# Patient Record
Sex: Male | Born: 1980 | Race: Black or African American | Hispanic: No | Marital: Married | Smoking: Current every day smoker
Health system: Southern US, Community
[De-identification: ages and names within clinical notes are randomized; demographics above are authoritative.]

## PROBLEM LIST (undated history)

## (undated) DIAGNOSIS — I10 Essential (primary) hypertension: Secondary | ICD-10-CM

## (undated) DIAGNOSIS — R079 Chest pain, unspecified: Secondary | ICD-10-CM

## (undated) DIAGNOSIS — E785 Hyperlipidemia, unspecified: Secondary | ICD-10-CM

## (undated) DIAGNOSIS — R42 Dizziness and giddiness: Secondary | ICD-10-CM

## (undated) DIAGNOSIS — Z899 Acquired absence of limb, unspecified: Secondary | ICD-10-CM

## (undated) DIAGNOSIS — F419 Anxiety disorder, unspecified: Secondary | ICD-10-CM

## (undated) DIAGNOSIS — K219 Gastro-esophageal reflux disease without esophagitis: Secondary | ICD-10-CM

## (undated) DIAGNOSIS — F32A Depression, unspecified: Secondary | ICD-10-CM

## (undated) HISTORY — DX: Chest pain, unspecified: R07.9

## (undated) HISTORY — DX: Essential (primary) hypertension: I10

## (undated) HISTORY — DX: Depression, unspecified: F32.A

## (undated) HISTORY — DX: Hyperlipidemia, unspecified: E78.5

## (undated) HISTORY — DX: Gastro-esophageal reflux disease without esophagitis: K21.9

## (undated) HISTORY — DX: Dizziness and giddiness: R42

## (undated) HISTORY — DX: Acquired absence of limb, unspecified: Z89.9

## (undated) HISTORY — DX: Anxiety disorder, unspecified: F41.9

---
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
## 2003-05-25 ENCOUNTER — Encounter: Admission: RE | Admit: 2003-05-25 | Discharge: 2003-05-25 | Payer: Self-pay | Admitting: Occupational Medicine

## 2003-05-25 ENCOUNTER — Encounter: Payer: Self-pay | Admitting: Occupational Medicine

## 2007-03-31 ENCOUNTER — Emergency Department (HOSPITAL_COMMUNITY): Admission: EM | Admit: 2007-03-31 | Discharge: 2007-03-31 | Payer: Self-pay | Admitting: Emergency Medicine

## 2008-09-24 ENCOUNTER — Emergency Department (HOSPITAL_COMMUNITY): Admission: EM | Admit: 2008-09-24 | Discharge: 2008-09-24 | Payer: Self-pay | Admitting: Emergency Medicine

## 2010-02-26 ENCOUNTER — Emergency Department (HOSPITAL_COMMUNITY): Admission: EM | Admit: 2010-02-26 | Discharge: 2010-02-26 | Payer: Self-pay | Admitting: Emergency Medicine

## 2010-02-28 ENCOUNTER — Emergency Department (HOSPITAL_COMMUNITY): Admission: EM | Admit: 2010-02-28 | Discharge: 2010-02-28 | Payer: Self-pay | Admitting: Emergency Medicine

## 2010-03-19 ENCOUNTER — Emergency Department (HOSPITAL_COMMUNITY): Admission: EM | Admit: 2010-03-19 | Discharge: 2010-03-19 | Payer: Self-pay | Admitting: Emergency Medicine

## 2015-08-03 ENCOUNTER — Encounter (HOSPITAL_COMMUNITY): Payer: Self-pay | Admitting: *Deleted

## 2015-08-03 ENCOUNTER — Emergency Department (HOSPITAL_COMMUNITY): Payer: Self-pay

## 2015-08-03 ENCOUNTER — Emergency Department (HOSPITAL_COMMUNITY)
Admission: EM | Admit: 2015-08-03 | Discharge: 2015-08-03 | Disposition: A | Discharge status: Home / Self Care | Payer: Self-pay | Attending: Emergency Medicine | Admitting: Emergency Medicine

## 2015-08-03 DIAGNOSIS — R42 Dizziness and giddiness: Secondary | ICD-10-CM | POA: Insufficient documentation

## 2015-08-03 DIAGNOSIS — Z720 Tobacco use: Secondary | ICD-10-CM | POA: Insufficient documentation

## 2015-08-03 DIAGNOSIS — R112 Nausea with vomiting, unspecified: Secondary | ICD-10-CM | POA: Insufficient documentation

## 2015-08-03 LAB — COMPREHENSIVE METABOLIC PANEL
ALK PHOS: 80 U/L (ref 38–126)
ALT: 30 U/L (ref 17–63)
ANION GAP: 10 (ref 5–15)
AST: 25 U/L (ref 15–41)
Albumin: 5 g/dL (ref 3.5–5.0)
BILIRUBIN TOTAL: 1 mg/dL (ref 0.3–1.2)
BUN: 11 mg/dL (ref 6–20)
CALCIUM: 9.8 mg/dL (ref 8.9–10.3)
CO2: 24 mmol/L (ref 22–32)
Chloride: 104 mmol/L (ref 101–111)
Creatinine, Ser: 1.14 mg/dL (ref 0.61–1.24)
GFR calc Af Amer: >60 mL/min (ref >60)
GFR calc non Af Amer: >60 mL/min (ref >60)
GLUCOSE: 94 mg/dL (ref 65–99)
Potassium: 4 mmol/L (ref 3.5–5.1)
Sodium: 138 mmol/L (ref 135–145)
Total Protein: 8.4 g/dL — ABNORMAL HIGH (ref 6.5–8.1)

## 2015-08-03 LAB — LIPASE, BLOOD: Lipase: 24 U/L (ref 11–51)

## 2015-08-03 LAB — WET PREP, GENITAL
Clue Cells Wet Prep HPF POC: NONE SEEN
Trich, Wet Prep: NONE SEEN
WBC, Wet Prep HPF POC: NONE SEEN
Yeast Wet Prep HPF POC: NONE SEEN

## 2015-08-03 LAB — CBC
HEMATOCRIT: 47.6 % (ref 39.0–52.0)
HEMOGLOBIN: 17 g/dL (ref 13.0–17.0)
MCH: 30.6 pg (ref 26.0–34.0)
MCHC: 35.7 g/dL (ref 30.0–36.0)
MCV: 85.6 fL (ref 78.0–100.0)
Platelets: 203 10*3/uL (ref 150–400)
RBC: 5.56 MIL/uL (ref 4.22–5.81)
RDW: 13.6 % (ref 11.5–15.5)
WBC: 11.7 10*3/uL — ABNORMAL HIGH (ref 4.0–10.5)

## 2015-08-03 LAB — URINALYSIS, ROUTINE W REFLEX MICROSCOPIC
BILIRUBIN URINE: NEGATIVE
GLUCOSE, UA: NEGATIVE mg/dL
HGB URINE DIPSTICK: NEGATIVE
KETONES UR: NEGATIVE mg/dL
Leukocytes, UA: NEGATIVE
Nitrite: NEGATIVE
PROTEIN: NEGATIVE mg/dL
SPECIFIC GRAVITY, URINE: 1.019 (ref 1.005–1.030)
Urobilinogen, UA: 0.2 mg/dL (ref 0.0–1.0)
pH: 6 (ref 5.0–8.0)

## 2015-08-03 LAB — CBG MONITORING, ED: GLUCOSE-CAPILLARY: 97 mg/dL (ref 65–99)

## 2015-08-03 MED ORDER — ONDANSETRON HCL 4 MG PO TABS: 4.0000 mg | ORAL_TABLET | Freq: Three times a day (TID) | ORAL | Status: DC | PRN

## 2015-08-03 NOTE — ED Notes (Addendum)
Pt reports dizziness and vomiting x2 days. Denies abd pain. Reports lower back pain 2/10. Drinks ETOH daily. Denies blood in urine or stool. Denies dysuria. Last bowel movement this am, diarrhea.

## 2015-08-03 NOTE — ED Provider Notes (Signed)
CSN: [id number]     Arrival date & time 08/03/15  1347 History   First MD Initiated Contact with Patient 08/03/15 1503     Chief Complaint  Patient presents with  . Dizziness  . Emesis     (Consider location/radiation/quality/duration/timing/severity/associated sxs/prior Treatment) HPI   Jason Li is a 34 y.o. male with no significant PMH who presents with 2 day history of dizziness.  Patient states he became dizzy 2 days ago when he was walking down the stairs while smoking a cigarette.   He also felt like his heart was racing and left arm sharp pains.  He states it has been constant for the past 2 days.  Seems to be made worse with smoking and stress.  Nothing tried to make it better.  Associated symptoms include nonbloody diarrhea, SOB, lightheadedness, visual floaters, headache, cough (chronic), and dysuria.  Denies bloody stools, hematuria, N/V, CP, vertigo, diplopia, abdominal pain, urinary frequency, fall, LOC, difficulty walking, or penile discharge.  He also states he has had a decreased appetite.  Smokes approximately a pack a day.  States he drinks 4 beers a day.  Denies any chronic illnesses.  No PCP.    No past medical history on file. No past surgical history on file. No family history on file. Social History  Substance Use Topics  . Smoking status: Current Every Day Smoker -- 0.50 packs/day for 20 years    Types: Cigarettes  . Smokeless tobacco: Not on file  . Alcohol Use: Yes     Comment: daily- 4 drinks/day    Review of Systems All other systems negative unless otherwise stated in HPI    Allergies  Review of patient's allergies indicates no known allergies.  Home Medications   Prior to Admission medications   Medication Sig Start Date End Date Taking? Authorizing Provider  ondansetron (ZOFRAN) 4 MG tablet Take 1 tablet (4 mg total) by mouth every 8 (eight) hours as needed for nausea or vomiting. 08/03/15   Ammy Lienhard, PA-C   BP 137/97 mmHg  Pulse 86   Temp(Src) 98.4 F (36.9 C) (Oral)  Resp 23  SpO2 100% Physical Exam  Constitutional: He is oriented to person, place, and time. He appears well-developed and well-nourished. No distress.  HENT:  Head: Normocephalic and atraumatic.  Mouth/Throat: Oropharynx is clear and moist.  Eyes: Conjunctivae are normal. Pupils are equal, round, and reactive to light.  Neck: Normal range of motion. Neck supple.  Cardiovascular: Normal rate, regular rhythm, normal heart sounds and intact distal pulses.   No murmur heard. Pulmonary/Chest: Effort normal and breath sounds normal. No accessory muscle usage or stridor. No respiratory distress. He has no wheezes. He has no rhonchi. He has no rales.  Abdominal: Soft. Bowel sounds are normal. He exhibits no distension. There is no tenderness. There is no rebound and no guarding.  Musculoskeletal: Normal range of motion.  Lymphadenopathy:    He has no cervical adenopathy.  Neurological: He is alert and oriented to person, place, and time.  Mental Status:   AOx3.  Speech clear without dysarthria. Cranial Nerves:  I-not tested  II-PERRLA  III, IV, VI-EOMs intact  V-temporal and masseter strength intact  VII-symmetrical facial movements intact, no facial droop  VIII-hearing grossly intact bilaterally  IX, X-gag intact  XI-strength of sternomastoid and trapezius muscles 5/5  XII-tongue midline Motor:   Good muscle bulk and tone  Strength 5/5 bilaterally in upper and lower extremities   Cerebellar--RAMs, finger to nose intact, heel  to shin intact  Romberg--maintains balance with eyes closed  Casual and tandem gait normal without ataxia  No pronator drift Sensory:  Intact in upper and lower extremities   Skin: Skin is warm and dry. He is not diaphoretic.  Psychiatric: He has a normal mood and affect. His behavior is normal.    ED Course  Procedures (including critical care time) Labs Review Labs Reviewed  COMPREHENSIVE METABOLIC PANEL -  Abnormal; Notable for the following:    Total Protein 8.4 (*)    All other components within normal limits  CBC - Abnormal; Notable for the following:    WBC 11.7 (*)    All other components within normal limits  WET PREP, GENITAL  LIPASE, BLOOD  URINALYSIS, ROUTINE W REFLEX MICROSCOPIC (NOT AT [facility])  CBG MONITORING, ED  GC/CHLAMYDIA PROBE AMP ([place]) NOT AT [facility]    Imaging Review Dg Chest 2 View  08/03/2015  CLINICAL DATA:  Shortness breath for 2-3 months, heart flutter for 3+ months. Chronic cough. EXAM: CHEST  2 VIEW COMPARISON:  None. FINDINGS: Heart size is normal. Overall cardiomediastinal silhouette is normal in size and configuration. Lungs are at least mildly hyperexpanded suggesting COPD. Lungs are clear. No pleural effusion. No pneumothorax. No osseous abnormality seen. IMPRESSION: 1. Lungs are at least mildly hyperexpanded suggesting COPD. 2. No evidence of acute cardiopulmonary abnormality. Heart size is normal. Lungs are clear. Electronically Signed   By: Bary Richard M.D.   On: 08/03/2015 16:13   I have personally reviewed and evaluated these images and lab results as part of my medical decision-making.   EKG Interpretation   Date/Time:  Saturday August 03 2015 15:50:03 EDT Ventricular Rate:  84 PR Interval:  140 QRS Duration: 81 QT Interval:  369 QTC Calculation: 436 R Axis:   34 Text Interpretation:  Sinus rhythm Borderline T abnormalities, inferior  leads No previous ECGs available Confirmed by [facility] MD, Barbara Cower [id number]) on  08/03/2015 3:54:44 PM      MDM   Final diagnoses:  Dizziness  Non-intractable vomiting with nausea, vomiting of unspecified type    Patient presents with dizziness x 2 days that began while smoking.  Assoc sxs include palpitations and sharp left arm pain.  VS, afebrile, HR 96, RR 20; 99%, 157/100.  On exam, no focal neurological deficits.  Heart RRR, lungs CTAB, abdomen soft, no rebound or guarding.  Will obtain lipase, CMP, CBC,  UA, CXR, EKG, and orthostatic vitals. Doubt neurogenic etiology given history and physical exam findings.  EKG shows NSR, not acute.  Doubt cardiac etiology.  No indication for further evaluation. Wet prep unremarkable CBG 97 Lipase and CMP unremarkable CBC shows mild leukocystosis of 11.7.  Doubt anemia. UA negative CXR shows mild hyperexpansion suggesting COPD.  No evidence of acute disease.  Will give zofran for reported emesis.  Patient stable for discharge.  Discussed return precautions.  Patient agrees and acknowledges the above plan for discharge.  Case has been discussed with and seen by Dr. Clayborne Dana who agrees with the above plan for discharge.   Cheri Fowler, PA-C 08/03/15 1801  Marily Memos, MD 08/03/15 6045  Marily Memos, MD 08/03/15 2251

## 2015-08-03 NOTE — Discharge Instructions (Signed)
Dizziness Dizziness is a common problem. It is a feeling of unsteadiness or light-headedness. You may feel like you are about to faint. Dizziness can lead to injury if you stumble or fall. Anyone can become dizzy, but dizziness is more common in older adults. This condition can be caused by a number of things, including medicines, dehydration, or illness. HOME CARE INSTRUCTIONS Taking these steps may help with your condition: Eating and Drinking  Drink enough fluid to keep your urine clear or pale yellow. This helps to keep you from becoming dehydrated. Try to drink more clear fluids, such as water.  Do not drink alcohol.  Limit your caffeine intake if directed by your health care provider.  Limit your salt intake if directed by your health care provider. Activity  Avoid making quick movements.  Rise slowly from chairs and steady yourself until you feel okay.  In the morning, first sit up on the side of the bed. When you feel okay, stand slowly while you hold onto something until you know that your balance is fine.  Move your legs often if you need to stand in one place for a long time. Tighten and relax your muscles in your legs while you are standing.  Do not drive or operate heavy machinery if you feel dizzy.  Avoid bending down if you feel dizzy. Place items in your home so that they are easy for you to reach without leaning over. Lifestyle  Do not use any tobacco products, including cigarettes, chewing tobacco, or electronic cigarettes. If you need help quitting, ask your health care provider.  Try to reduce your stress level, such as with yoga or meditation. Talk with your health care provider if you need help. General Instructions  Watch your dizziness for any changes.  Take medicines only as directed by your health care provider. Talk with your health care provider if you think that your dizziness is caused by a medicine that you are taking.  Tell a friend or a family  member that you are feeling dizzy. If he or she notices any changes in your behavior, have this person call your health care provider.  Keep all follow-up visits as directed by your health care provider. This is important. SEEK MEDICAL CARE IF:  Your dizziness does not go away.  Your dizziness or light-headedness gets worse.  You feel nauseous.  You have reduced hearing.  You have new symptoms.  You are unsteady on your feet or you feel like the room is spinning. SEEK IMMEDIATE MEDICAL CARE IF:  You vomit or have diarrhea and are unable to eat or drink anything.  You have problems talking, walking, swallowing, or using your arms, hands, or legs.  You feel generally weak.  You are not thinking clearly or you have trouble forming sentences. It may take a friend or family member to notice this.  You have chest pain, abdominal pain, shortness of breath, or sweating.  Your vision changes.  You notice any bleeding.  You have a headache.  You have neck pain or a stiff neck.  You have a fever.   This information is not intended to replace advice given to you by your health care provider. Make sure you discuss any questions you have with your health care provider.   Document Released: 03/17/2001 Document Revised: 02/05/2015 Document Reviewed: 09/17/2014 Elsevier Interactive Patient Education 2016 Elsevier Inc.  Nausea, Adult Nausea is the feeling that you have an upset stomach or have to vomit. Nausea by  itself is not likely a serious concern, but it may be an early sign of more serious medical problems. As nausea gets worse, it can lead to vomiting. If vomiting develops, there is the risk of dehydration.  CAUSES   Viral infections.  Food poisoning.  Medicines.  Pregnancy.  Motion sickness.  Migraine headaches.  Emotional distress.  Severe pain from any source.  Alcohol intoxication. HOME CARE INSTRUCTIONS  Get plenty of rest.  Ask your caregiver about  specific rehydration instructions.  Eat small amounts of food and sip liquids more often.  Take all medicines as told by your caregiver. SEEK MEDICAL CARE IF:  You have not improved after 2 days, or you get worse.  You have a headache. SEEK IMMEDIATE MEDICAL CARE IF:   You have a fever.  You faint.  You keep vomiting or have blood in your vomit.  You are extremely weak or dehydrated.  You have dark or bloody stools.  You have severe chest or abdominal pain. MAKE SURE YOU:  Understand these instructions.  Will watch your condition.  Will get help right away if you are not doing well or get worse.   This information is not intended to replace advice given to you by your health care provider. Make sure you discuss any questions you have with your health care provider.   Document Released: 10/29/2004 Document Revised: 10/12/2014 Document Reviewed: 06/03/2011 Elsevier Interactive Patient Education [employer]2016 Elsevier Inc.

## 2015-08-03 NOTE — ED Notes (Signed)
Pt has multiple complaints--loss of appetite, heart palpitations, back pain, leg pain, light headedness, dizzy spells, vomiting.

## 2015-08-05 LAB — GC/CHLAMYDIA PROBE AMP (~~LOC~~) NOT AT ARMC
Chlamydia: NEGATIVE
Neisseria Gonorrhea: NEGATIVE

## 2015-12-26 ENCOUNTER — Emergency Department (HOSPITAL_COMMUNITY): Payer: Self-pay

## 2015-12-26 ENCOUNTER — Encounter (HOSPITAL_COMMUNITY): Payer: Self-pay | Admitting: Emergency Medicine

## 2015-12-26 ENCOUNTER — Emergency Department (HOSPITAL_COMMUNITY)
Admission: EM | Admit: 2015-12-26 | Discharge: 2015-12-27 | Disposition: A | Discharge status: Home / Self Care | Payer: Self-pay | Attending: Emergency Medicine | Admitting: Emergency Medicine

## 2015-12-26 DIAGNOSIS — R1013 Epigastric pain: Secondary | ICD-10-CM | POA: Insufficient documentation

## 2015-12-26 DIAGNOSIS — R112 Nausea with vomiting, unspecified: Secondary | ICD-10-CM | POA: Insufficient documentation

## 2015-12-26 DIAGNOSIS — F17210 Nicotine dependence, cigarettes, uncomplicated: Secondary | ICD-10-CM | POA: Insufficient documentation

## 2015-12-26 LAB — CBC
HEMATOCRIT: 46.8 % (ref 39.0–52.0)
HEMOGLOBIN: 16.1 g/dL (ref 13.0–17.0)
MCH: 29.7 pg (ref 26.0–34.0)
MCHC: 34.4 g/dL (ref 30.0–36.0)
MCV: 86.2 fL (ref 78.0–100.0)
Platelets: 208 10*3/uL (ref 150–400)
RBC: 5.43 MIL/uL (ref 4.22–5.81)
RDW: 13.3 % (ref 11.5–15.5)
WBC: 9.7 10*3/uL (ref 4.0–10.5)

## 2015-12-26 LAB — COMPREHENSIVE METABOLIC PANEL
ALT: 20 U/L (ref 17–63)
ANION GAP: 11 (ref 5–15)
AST: 19 U/L (ref 15–41)
Albumin: 4.5 g/dL (ref 3.5–5.0)
Alkaline Phosphatase: 73 U/L (ref 38–126)
BILIRUBIN TOTAL: 1.5 mg/dL — AB (ref 0.3–1.2)
BUN: 10 mg/dL (ref 6–20)
CHLORIDE: 101 mmol/L (ref 101–111)
CO2: 28 mmol/L (ref 22–32)
Calcium: 9.6 mg/dL (ref 8.9–10.3)
Creatinine, Ser: 1.08 mg/dL (ref 0.61–1.24)
GFR calc Af Amer: >60 mL/min (ref >60)
Glucose, Bld: 96 mg/dL (ref 65–99)
POTASSIUM: 4.2 mmol/L (ref 3.5–5.1)
Sodium: 140 mmol/L (ref 135–145)
TOTAL PROTEIN: 7.9 g/dL (ref 6.5–8.1)

## 2015-12-26 LAB — URINALYSIS, ROUTINE W REFLEX MICROSCOPIC
Glucose, UA: NEGATIVE mg/dL
Hgb urine dipstick: NEGATIVE
Ketones, ur: NEGATIVE mg/dL
LEUKOCYTES UA: NEGATIVE
NITRITE: NEGATIVE
PH: 5.5 (ref 5.0–8.0)
Protein, ur: NEGATIVE mg/dL
SPECIFIC GRAVITY, URINE: 1.024 (ref 1.005–1.030)

## 2015-12-26 LAB — LIPASE, BLOOD: LIPASE: 23 U/L (ref 11–51)

## 2015-12-26 MED ORDER — GI COCKTAIL ~~LOC~~
30.0000 mL | Freq: Once | ORAL | Status: AC
  Administered 2015-12-27: 30 mL via ORAL
  Filled 2015-12-26: qty 30

## 2015-12-26 NOTE — ED Notes (Addendum)
Pt states he has been feeling bloated for about 2-3 days despite not eating. Pt states he has left arm pain that has lasted for about the same amount of time from sleeping on his arm wrong. Pt also presents with c/o anxiety; he states he gets worried and stressed and is unable to control his anxiety levels at home.  Pt's last bowel movement was today and was normal, pt had 1 episode of emesis this am. Pt states he drinks a lot of beer, his last of which was last night. Pt states on a normal day he drinks about 7 beers a night. Pt also states he has burning with urination.

## 2015-12-26 NOTE — ED Provider Notes (Signed)
CSN: [id number]     Arrival date & time 12/26/15  2044 History   First MD Initiated Contact with Patient 12/26/15 2211     No chief complaint on file.    (Consider location/radiation/quality/duration/timing/severity/associated sxs/prior Treatment) Patient is a 35 y.o. male presenting with abdominal pain. The history is provided by the patient.  Abdominal Pain Pain location:  Epigastric Pain quality: sharp and shooting   Pain radiation: left arm. Pain severity:  Moderate Onset quality:  Sudden Duration:  2 days Timing:  Constant Progression:  Worsening Chronicity:  New Relieved by:  Nothing Worsened by:  Nothing tried Ineffective treatments:  None tried Associated symptoms: nausea and vomiting   Associated symptoms: no chest pain, no chills, no diarrhea, no fever and no shortness of breath    35 yo M With a chief complaint of feeling like his abdomen is bloated. This been going on for the past couple days. Patient has some mild epigastric burning as well. Patient has an everyday drinker feels like this makes it worse. Has had a couple episodes of vomiting, denies diarrhea.   History reviewed. No pertinent past medical history. History reviewed. No pertinent past surgical history. No family history on file. Social History  Substance Use Topics  . Smoking status: Current Every Day Smoker -- 0.50 packs/day for 20 years    Types: Cigarettes  . Smokeless tobacco: None  . Alcohol Use: Yes     Comment: daily- 7 drinks/day    Review of Systems  Constitutional: Negative for fever and chills.  HENT: Negative for congestion and facial swelling.   Eyes: Negative for discharge and visual disturbance.  Respiratory: Negative for shortness of breath.   Cardiovascular: Negative for chest pain and palpitations.  Gastrointestinal: Positive for nausea, vomiting and abdominal pain. Negative for diarrhea.  Musculoskeletal: Negative for myalgias and arthralgias.  Skin: Negative for color  change and rash.  Neurological: Negative for tremors, syncope and headaches.  Psychiatric/Behavioral: Negative for confusion and dysphoric mood.      Allergies  Review of patient's allergies indicates no known allergies.  Home Medications   Prior to Admission medications   Medication Sig Start Date End Date Taking? Authorizing Provider  ibuprofen (ADVIL,MOTRIN) 200 MG tablet Take 400 mg by mouth every 6 (six) hours as needed for moderate pain.   Yes Historical Provider, MD  ondansetron (ZOFRAN) 4 MG tablet Take 1 tablet (4 mg total) by mouth every 8 (eight) hours as needed for nausea or vomiting. Patient not taking: Reported on 12/26/2015 08/03/15   Cheri Fowler, PA-C   BP 136/101 mmHg  Pulse 73  Temp(Src) 98.6 F (37 C) (Oral)  Resp 16  SpO2 97% Physical Exam  Constitutional: He is oriented to person, place, and time. He appears well-developed and well-nourished.  HENT:  Head: Normocephalic and atraumatic.  Eyes: EOM are normal. Pupils are equal, round, and reactive to light.  Neck: Normal range of motion. Neck supple. No JVD present.  Cardiovascular: Normal rate and regular rhythm.  Exam reveals no gallop and no friction rub.   No murmur heard. Pulmonary/Chest: No respiratory distress. He has no wheezes.  Abdominal: He exhibits no distension. There is no tenderness. There is no rebound and no guarding.  Benign abdominal exam   Musculoskeletal: Normal range of motion.  Neurological: He is alert and oriented to person, place, and time.  Skin: No rash noted. No pallor.  Psychiatric: He has a normal mood and affect. His behavior is normal.  Nursing note and  vitals reviewed.   ED Course  Procedures (including critical care time) Labs Review Labs Reviewed  COMPREHENSIVE METABOLIC PANEL - Abnormal; Notable for the following:    Total Bilirubin 1.5 (*)    All other components within normal limits  URINALYSIS, ROUTINE W REFLEX MICROSCOPIC (NOT AT [facility]) - Abnormal; Notable for  the following:    Color, Urine AMBER (*)    Bilirubin Urine SMALL (*)    All other components within normal limits  LIPASE, BLOOD  CBC    Imaging Review Dg Chest 2 View  12/26/2015  CLINICAL DATA:  Upper abdominal pain going into the right chest for 3 weeks. Nausea. Pain radiates to the left arm. EXAM: CHEST  2 VIEW COMPARISON:  08/03/2015 FINDINGS: Mild hyperinflation. The heart size and mediastinal contours are within normal limits. Both lungs are clear. The visualized skeletal structures are unremarkable. IMPRESSION: No active cardiopulmonary disease. Electronically Signed   By: Burman NievesWilliam  Stevens M.D.   On: 12/26/2015 23:11   I have personally reviewed and evaluated these images and lab results as part of my medical decision-making.   EKG Interpretation   Date/Time:  Thursday December 26 2015 23:07:47 EDT Ventricular Rate:  72 PR Interval:  142 QRS Duration: 86 QT Interval:  371 QTC Calculation: 406 R Axis:   -2 Text Interpretation:  Sinus rhythm Consider left atrial enlargement  Nonspecific T abnormalities, inferior leads No significant change since  last tracing Confirmed by Ceaser Ebeling MD, Reuel BoomANIEL [id number]) on 12/26/2015 11:14:45  PM      MDM   Final diagnoses:  Epigastric abdominal pain    35 yo M with a cc of bloating.  Also has some epigastric pain that radiates to the left arm.  Benign abdominal exam.  Doubt acs, will obtain lab eval cxr, ecg.   Laboratory evaluation is unremarkable. Chest x-ray negative as viewed by me. EKG unremarkable and unchanged from previous. Trial of zantac, d/c home.   Discussed smoking cessation with patient and was they were offerred resources to help stop.  Total time was 5 min CPT code [id number].   11:40 PM:  I have discussed the diagnosis/risks/treatment options with the patient and family and believe the pt to be eligible for discharge home to follow-up with PCP. We also discussed returning to the ED immediately if new or worsening sx occur. We  discussed the sx which are most concerning (e.g., sudden worsening pain, fever, inability to tolerate by mouth) that necessitate immediate return. Medications administered to the patient during their visit and any new prescriptions provided to the patient are listed below.  Medications given during this visit Medications  gi cocktail (Maalox,Lidocaine,Donnatal) (not administered)    New Prescriptions   No medications on file    The patient appears reasonably screen and/or stabilized for discharge and I doubt any other medical condition or other [facility] requiring further screening, evaluation, or treatment in the ED at this time prior to discharge.      Melene Planan Tashari Schoenfelder, DO 12/26/15 2340

## 2015-12-26 NOTE — ED Notes (Signed)
Pt reports epigastric pain rated 8/10 and burning on urination, no penile discharge x 2 weeks. He did not see a doctor when there was discharge previously but states that the symptoms resolved on their own.

## 2015-12-26 NOTE — Discharge Instructions (Signed)
Try zantac 150mg twice a day.   °Abdominal Pain, Adult °Many things can cause abdominal pain. Usually, abdominal pain is not caused by a disease and will improve without treatment. It can often be observed and treated at home. Your health care provider will do a physical exam and possibly order blood tests and X-rays to help determine the seriousness of your pain. However, in many cases, more time must pass before a clear cause of the pain can be found. Before that point, your health care provider may not know if you need more testing or further treatment. °HOME CARE INSTRUCTIONS °Monitor your abdominal pain for any changes. The following actions may help to alleviate any discomfort you are experiencing: °· Only take over-the-counter or prescription medicines as directed by your health care provider. °· Do not take laxatives unless directed to do so by your health care provider. °· Try a clear liquid diet (broth, tea, or water) as directed by your health care provider. Slowly move to a bland diet as tolerated. °SEEK MEDICAL CARE IF: °· You have unexplained abdominal pain. °· You have abdominal pain associated with nausea or diarrhea. °· You have pain when you urinate or have a bowel movement. °· You experience abdominal pain that wakes you in the night. °· You have abdominal pain that is worsened or improved by eating food. °· You have abdominal pain that is worsened with eating fatty foods. °· You have a fever. °SEEK IMMEDIATE MEDICAL CARE IF: °· Your pain does not go away within 2 hours. °· You keep throwing up (vomiting). °· Your pain is felt only in portions of the abdomen, such as the right side or the left lower portion of the abdomen. °· You pass bloody or black tarry stools. °MAKE SURE YOU: °· Understand these instructions. °· Will watch your condition. °· Will get help right away if you are not doing well or get worse. °  °This information is not intended to replace advice given to you by your health care  provider. Make sure you discuss any questions you have with your health care provider. °  °Document Released: 07/01/2005 Document Revised: 06/12/2015 Document Reviewed: 05/31/2013 °Elsevier Interactive Patient Education ©2016 Elsevier Inc. ° °

## 2016-02-28 ENCOUNTER — Ambulatory Visit: Payer: Self-pay

## 2016-04-11 ENCOUNTER — Emergency Department (HOSPITAL_COMMUNITY): Payer: Self-pay

## 2016-04-11 ENCOUNTER — Emergency Department (HOSPITAL_COMMUNITY)
Admission: EM | Admit: 2016-04-11 | Discharge: 2016-04-11 | Disposition: A | Discharge status: Home / Self Care | Payer: Self-pay | Attending: Emergency Medicine | Admitting: Emergency Medicine

## 2016-04-11 ENCOUNTER — Encounter (HOSPITAL_COMMUNITY): Payer: Self-pay | Admitting: Emergency Medicine

## 2016-04-11 DIAGNOSIS — Z79899 Other long term (current) drug therapy: Secondary | ICD-10-CM | POA: Insufficient documentation

## 2016-04-11 DIAGNOSIS — R0789 Other chest pain: Secondary | ICD-10-CM | POA: Insufficient documentation

## 2016-04-11 DIAGNOSIS — R079 Chest pain, unspecified: Secondary | ICD-10-CM

## 2016-04-11 DIAGNOSIS — F17210 Nicotine dependence, cigarettes, uncomplicated: Secondary | ICD-10-CM | POA: Insufficient documentation

## 2016-04-11 LAB — BASIC METABOLIC PANEL
Anion gap: 9 (ref 5–15)
BUN: 13 mg/dL (ref 6–20)
CHLORIDE: 104 mmol/L (ref 101–111)
CO2: 25 mmol/L (ref 22–32)
CREATININE: 1.15 mg/dL (ref 0.61–1.24)
Calcium: 9.5 mg/dL (ref 8.9–10.3)
GFR calc Af Amer: >60 mL/min (ref >60)
GFR calc non Af Amer: >60 mL/min (ref >60)
GLUCOSE: 91 mg/dL (ref 65–99)
POTASSIUM: 3.8 mmol/L (ref 3.5–5.1)
SODIUM: 138 mmol/L (ref 135–145)

## 2016-04-11 LAB — HEPATIC FUNCTION PANEL
ALK PHOS: 79 U/L (ref 38–126)
ALT: 28 U/L (ref 17–63)
AST: 23 U/L (ref 15–41)
Albumin: 4.7 g/dL (ref 3.5–5.0)
BILIRUBIN DIRECT: 0.1 mg/dL (ref 0.1–0.5)
BILIRUBIN INDIRECT: 0.9 mg/dL (ref 0.3–0.9)
BILIRUBIN TOTAL: 1 mg/dL (ref 0.3–1.2)
Total Protein: 8.1 g/dL (ref 6.5–8.1)

## 2016-04-11 LAB — CBC
HEMATOCRIT: 46.5 % (ref 39.0–52.0)
Hemoglobin: 16.7 g/dL (ref 13.0–17.0)
MCH: 30.1 pg (ref 26.0–34.0)
MCHC: 35.9 g/dL (ref 30.0–36.0)
MCV: 83.9 fL (ref 78.0–100.0)
PLATELETS: 220 10*3/uL (ref 150–400)
RBC: 5.54 MIL/uL (ref 4.22–5.81)
RDW: 13.6 % (ref 11.5–15.5)
WBC: 9 10*3/uL (ref 4.0–10.5)

## 2016-04-11 LAB — I-STAT TROPONIN, ED: Troponin i, poc: 0 ng/mL (ref 0.00–0.08)

## 2016-04-11 LAB — LIPASE, BLOOD: LIPASE: 25 U/L (ref 11–51)

## 2016-04-11 NOTE — ED Provider Notes (Signed)
CSN: [id number]     Arrival date & time 04/11/16  1654 History   First MD Initiated Contact with Patient 04/11/16 1739     Chief Complaint  Patient presents with  . Chest Pain      HPI Patient presents with mid chest pain. States it radiated to the right side. States he was at home when he came on. States he was watching TV. It was a dull pain. No real shortness of breath with it. No nausea. States it felt somewhat like gas. States now is more in his upper abdomen. States he had drank a lot of alcohol the last few days. States he does drink about 2 40 ounce beers a day. Denies other drug use. States he had been drinking cigarette when the pain started today. No coronary artery disease history. Denies substance abuse. States his heart felt as if it was beating strangely. Not really fast but it was more aware of.   History reviewed. No pertinent past medical history. No past surgical history on file. No family history on file. Social History  Substance Use Topics  . Smoking status: Current Every Day Smoker -- 0.50 packs/day for 20 years    Types: Cigarettes  . Smokeless tobacco: None  . Alcohol Use: Yes     Comment: daily- 7 drinks/day    Review of Systems  Constitutional: Negative for activity change and appetite change.  Eyes: Negative for pain.  Respiratory: Negative for chest tightness and shortness of breath.   Cardiovascular: Positive for chest pain and palpitations. Negative for leg swelling.  Gastrointestinal: Negative for nausea, vomiting, abdominal pain and diarrhea.  Genitourinary: Negative for flank pain.  Musculoskeletal: Negative for back pain and neck stiffness.  Skin: Negative for rash.  Neurological: Negative for weakness, numbness and headaches.  Psychiatric/Behavioral: Negative for behavioral problems.      Allergies  Review of patient's allergies indicates no known allergies.  Home Medications   Prior to Admission medications   Medication Sig Start Date  End Date Taking? Authorizing Provider  alum & mag hydroxide-simeth (MAALOX/MYLANTA) 200-200-20 MG/5ML suspension Take 30-45 mLs by mouth every 6 (six) hours as needed for indigestion or heartburn.   Yes Historical Provider, MD  Calcium Carbonate Antacid (MAALOX PO) Take 1-2 tablets by mouth daily as needed (indigestion).   Yes Historical Provider, MD   BP 137/110 mmHg  Pulse 97  Temp(Src) 98 F (36.7 C) (Oral)  Resp 18  Ht  (1.676 m)  Wt 180 lb (81.647 kg)  BMI 29.07 kg/m2  SpO2 100% Physical Exam  Constitutional: He is oriented to person, place, and time. He appears well-developed and well-nourished.  HENT:  Head: Normocephalic and atraumatic.  Eyes: Pupils are equal, round, and reactive to light.  Neck: Normal range of motion. Neck supple.  Cardiovascular: Normal rate, regular rhythm and normal heart sounds.   Pulmonary/Chest: Effort normal and breath sounds normal.  Abdominal: Soft. Bowel sounds are normal. He exhibits no distension. There is tenderness.  Mild epigastric tenderness without rebound or guarding.  Musculoskeletal: Normal range of motion. He exhibits no edema.  Neurological: He is alert and oriented to person, place, and time.  Skin: Skin is warm.  Psychiatric: He has a normal mood and affect.  Nursing note and vitals reviewed.   ED Course  Procedures (including critical care time) Labs Review Labs Reviewed  BASIC METABOLIC PANEL  CBC  HEPATIC FUNCTION PANEL  LIPASE, BLOOD  I-STAT TROPOININ, ED    Imaging Review Dg  Chest 2 View  04/11/2016  CLINICAL DATA:  35 year old male with central chest pain and shortness breath for the past 3 hours today. EXAM: CHEST  2 VIEW COMPARISON:  Chest x-ray 12/26/2015. FINDINGS: Lung volumes are normal. No consolidative airspace disease. No pleural effusions. No pneumothorax. No pulmonary nodule or mass noted. Pulmonary vasculature and the cardiomediastinal silhouette are within normal limits. IMPRESSION: No radiographic  evidence of acute cardiopulmonary disease. Electronically Signed   By: Trudie Reedaniel  Entrikin M.D.   On: 04/11/2016 17:20   I have personally reviewed and evaluated these images and lab results as part of my medical decision-making.   EKG Interpretation   Date/Time:  Saturday April 11 2016 17:03:17 EDT Ventricular Rate:  89 PR Interval:    QRS Duration: 75 QT Interval:  336 QTC Calculation: 409 R Axis:   28 Text Interpretation:  Sinus rhythm LAE, consider biatrial enlargement  Borderline T wave abnormalities No significant change since last tracing  Reconfirmed by Aerin Delany  MD, Harrold DonathNATHAN [phone]([id number]) on 04/11/2016 5:44:27 PM      MDM   Final diagnoses:  Chest pain, unspecified chest pain type    Patient with chest pain. EKG and lab work reassuring. Stable from previous. Also had epigastric pain. Lipase and hepatic function normal. Will discharge home. Blood pressure is mildly elevated and will be followed.    Benjiman CoreNathan Eryck Negron, MD 04/11/16 [phone]

## 2016-04-11 NOTE — ED Notes (Signed)
Pt states that around 1330 he was lying in bed and began feeling tightness in his chest that radiates to his right side. Pt denies dizziness, but states he is lightheaded. Pt does not have HTN or DM, but he does drink and smoke. Pt states he last drank etoh last night. Pt is not diaphoretic

## 2016-04-11 NOTE — Discharge Instructions (Signed)
Nonspecific Chest Pain  °Chest pain can be caused by many different conditions. There is always a chance that your pain could be related to something serious, such as a heart attack or a blood clot in your lungs. Chest pain can also be caused by conditions that are not life-threatening. If you have chest pain, it is very important to follow up with your health care provider. °CAUSES  °Chest pain can be caused by: °· Heartburn. °· Pneumonia or bronchitis. °· Anxiety or stress. °· Inflammation around your heart (pericarditis) or lung (pleuritis or pleurisy). °· A blood clot in your lung. °· A collapsed lung (pneumothorax). It can develop suddenly on its own (spontaneous pneumothorax) or from trauma to the chest. °· Shingles infection (varicella-zoster virus). °· Heart attack. °· Damage to the bones, muscles, and cartilage that make up your chest wall. This can include: °¨ Bruised bones due to injury. °¨ Strained muscles or cartilage due to frequent or repeated coughing or overwork. °¨ Fracture to one or more ribs. °¨ Sore cartilage due to inflammation (costochondritis). °RISK FACTORS  °Risk factors for chest pain may include: °· Activities that increase your risk for trauma or injury to your chest. °· Respiratory infections or conditions that cause frequent coughing. °· Medical conditions or overeating that can cause heartburn. °· Heart disease or family history of heart disease. °· Conditions or health behaviors that increase your risk of developing a blood clot. °· Having had chicken pox (varicella zoster). °SIGNS AND SYMPTOMS °Chest pain can feel like: °· Burning or tingling on the surface of your chest or deep in your chest. °· Crushing, pressure, aching, or squeezing pain. °· Dull or sharp pain that is worse when you move, cough, or take a deep breath. °· Pain that is also felt in your back, neck, shoulder, or arm, or pain that spreads to any of these areas. °Your chest pain may come and go, or it may stay  constant. °DIAGNOSIS °Lab tests or other studies may be needed to find the cause of your pain. Your health care provider may have you take a test called an ambulatory ECG (electrocardiogram). An ECG records your heartbeat patterns at the time the test is performed. You may also have other tests, such as: °· Transthoracic echocardiogram (TTE). During echocardiography, sound waves are used to create a picture of all of the heart structures and to look at how blood flows through your heart. °· Transesophageal echocardiogram (TEE). This is a more advanced imaging test that obtains images from inside your body. It allows your health care provider to see your heart in finer detail. °· Cardiac monitoring. This allows your health care provider to monitor your heart rate and rhythm in real time. °· Holter monitor. This is a portable device that records your heartbeat and can help to diagnose abnormal heartbeats. It allows your health care provider to track your heart activity for several days, if needed. °· Stress tests. These can be done through exercise or by taking medicine that makes your heart beat more quickly. °· Blood tests. °· Imaging tests. °TREATMENT  °Your treatment depends on what is causing your chest pain. Treatment may include: °· Medicines. These may include: °¨ Acid blockers for heartburn. °¨ Anti-inflammatory medicine. °¨ Pain medicine for inflammatory conditions. °¨ Antibiotic medicine, if an infection is present. °¨ Medicines to dissolve blood clots. °¨ Medicines to treat coronary artery disease. °· Supportive care for conditions that do not require medicines. This may include: °¨ Resting. °¨ Applying heat   or cold packs to injured areas. °¨ Limiting activities until pain decreases. °HOME CARE INSTRUCTIONS °· If you were prescribed an antibiotic medicine, finish it all even if you start to feel better. °· Avoid any activities that bring on chest pain. °· Do not use any tobacco products, including  cigarettes, chewing tobacco, or electronic cigarettes. If you need help quitting, ask your health care provider. °· Do not drink alcohol. °· Take medicines only as directed by your health care provider. °· Keep all follow-up visits as directed by your health care provider. This is important. This includes any further testing if your chest pain does not go away. °· If heartburn is the cause for your chest pain, you may be told to keep your head raised (elevated) while sleeping. This reduces the chance that acid will go from your stomach into your esophagus. °· Make lifestyle changes as directed by your health care provider. These may include: °¨ Getting regular exercise. Ask your health care provider to suggest some activities that are safe for you. °¨ Eating a heart-healthy diet. A registered dietitian can help you to learn healthy eating options. °¨ Maintaining a healthy weight. °¨ Managing diabetes, if necessary. °¨ Reducing stress. °SEEK MEDICAL CARE IF: °· Your chest pain does not go away after treatment. °· You have a rash with blisters on your chest. °· You have a fever. °SEEK IMMEDIATE MEDICAL CARE IF:  °· Your chest pain is worse. °· You have an increasing cough, or you cough up blood. °· You have severe abdominal pain. °· You have severe weakness. °· You faint. °· You have chills. °· You have sudden, unexplained chest discomfort. °· You have sudden, unexplained discomfort in your arms, back, neck, or jaw. °· You have shortness of breath at any time. °· You suddenly start to sweat, or your skin gets clammy. °· You feel nauseous or you vomit. °· You suddenly feel light-headed or dizzy. °· Your heart begins to beat quickly, or it feels like it is skipping beats. °These symptoms may represent a serious problem that is an emergency. Do not wait to see if the symptoms will go away. Get medical help right away. Call your local emergency services (911 in the [place] Do not drive yourself to the hospital. °  °This  information is not intended to replace advice given to you by your health care provider. Make sure you discuss any questions you have with your health care provider. °  °Document Released: 07/01/2005 Document Revised: 10/12/2014 Document Reviewed: 04/27/2014 °Elsevier Interactive Patient Education ©2016 [employer] ° °

## 2019-01-13 ENCOUNTER — Ambulatory Visit (HOSPITAL_COMMUNITY)
Admission: EM | Admit: 2019-01-13 | Discharge: 2019-01-13 | Disposition: A | Discharge status: Home / Self Care | Attending: Emergency Medicine | Admitting: Emergency Medicine

## 2019-01-13 ENCOUNTER — Emergency Department (HOSPITAL_COMMUNITY)

## 2019-01-13 ENCOUNTER — Emergency Department (HOSPITAL_COMMUNITY): Admitting: Certified Registered"

## 2019-01-13 ENCOUNTER — Encounter (HOSPITAL_COMMUNITY): Payer: Self-pay | Admitting: Emergency Medicine

## 2019-01-13 ENCOUNTER — Encounter (HOSPITAL_COMMUNITY)
Admission: EM | Disposition: A | Discharge status: Home / Self Care | Payer: Self-pay | Source: Home / Self Care | Attending: Emergency Medicine

## 2019-01-13 DIAGNOSIS — W290XXA Contact with powered kitchen appliance, initial encounter: Secondary | ICD-10-CM | POA: Diagnosis not present

## 2019-01-13 DIAGNOSIS — Z6833 Body mass index (BMI) 33.0-33.9, adult: Secondary | ICD-10-CM | POA: Insufficient documentation

## 2019-01-13 DIAGNOSIS — S68129A Partial traumatic metacarpophalangeal amputation of unspecified finger, initial encounter: Secondary | ICD-10-CM

## 2019-01-13 DIAGNOSIS — S68625A Partial traumatic transphalangeal amputation of left ring finger, initial encounter: Secondary | ICD-10-CM | POA: Diagnosis not present

## 2019-01-13 DIAGNOSIS — E669 Obesity, unspecified: Secondary | ICD-10-CM | POA: Diagnosis not present

## 2019-01-13 DIAGNOSIS — S68119A Complete traumatic metacarpophalangeal amputation of unspecified finger, initial encounter: Secondary | ICD-10-CM

## 2019-01-13 DIAGNOSIS — F17210 Nicotine dependence, cigarettes, uncomplicated: Secondary | ICD-10-CM | POA: Insufficient documentation

## 2019-01-13 HISTORY — PX: I&D EXTREMITY: SHX5045

## 2019-01-13 SURGERY — IRRIGATION AND DEBRIDEMENT EXTREMITY
Anesthesia: General | Site: Hand | Laterality: Left

## 2019-01-13 MED ORDER — MIDAZOLAM HCL 2 MG/2ML IJ SOLN: 0.5000 mg | Freq: Once | INTRAMUSCULAR | Status: DC | PRN

## 2019-01-13 MED ORDER — CEPHALEXIN 250 MG PO CAPS: 250.0000 mg | ORAL_CAPSULE | Freq: Four times a day (QID) | ORAL | 0 refills | Status: AC

## 2019-01-13 MED ORDER — ROCURONIUM BROMIDE 10 MG/ML (PF) SYRINGE
PREFILLED_SYRINGE | INTRAVENOUS | Status: DC | PRN
  Administered 2019-01-13: 20 mg via INTRAVENOUS

## 2019-01-13 MED ORDER — PROMETHAZINE HCL 25 MG/ML IJ SOLN: 6.2500 mg | INTRAMUSCULAR | Status: DC | PRN

## 2019-01-13 MED ORDER — TETANUS-DIPHTH-ACELL PERTUSSIS 5-2.5-18.5 LF-MCG/0.5 IM SUSP
0.5000 mL | Freq: Once | INTRAMUSCULAR | Status: AC
  Administered 2019-01-13: 0.5 mL via INTRAMUSCULAR
  Filled 2019-01-13: qty 0.5

## 2019-01-13 MED ORDER — SODIUM CHLORIDE 0.9 % IR SOLN
Status: DC | PRN
  Administered 2019-01-13: 1000 mL

## 2019-01-13 MED ORDER — LIDOCAINE 2% (20 MG/ML) 5 ML SYRINGE
INTRAMUSCULAR | Status: DC | PRN
  Administered 2019-01-13: 20 mg via INTRAVENOUS

## 2019-01-13 MED ORDER — PROPOFOL 10 MG/ML IV BOLUS
INTRAVENOUS | Status: AC
  Filled 2019-01-13: qty 20

## 2019-01-13 MED ORDER — MIDAZOLAM HCL 5 MG/5ML IJ SOLN
INTRAMUSCULAR | Status: DC | PRN
  Administered 2019-01-13: 2 mg via INTRAVENOUS

## 2019-01-13 MED ORDER — FENTANYL CITRATE (PF) 250 MCG/5ML IJ SOLN
INTRAMUSCULAR | Status: DC | PRN
  Administered 2019-01-13 (×2): 50 ug via INTRAVENOUS

## 2019-01-13 MED ORDER — MIDAZOLAM HCL 2 MG/2ML IJ SOLN
INTRAMUSCULAR | Status: AC
  Filled 2019-01-13: qty 2

## 2019-01-13 MED ORDER — LACTATED RINGERS IV SOLN: INTRAVENOUS | Status: DC

## 2019-01-13 MED ORDER — PROPOFOL 10 MG/ML IV BOLUS
INTRAVENOUS | Status: DC | PRN
  Administered 2019-01-13: 200 mg via INTRAVENOUS
  Administered 2019-01-13: 20 mg via INTRAVENOUS

## 2019-01-13 MED ORDER — HYDROMORPHONE HCL 1 MG/ML IJ SOLN: 0.2500 mg | INTRAMUSCULAR | Status: DC | PRN

## 2019-01-13 MED ORDER — POVIDONE-IODINE 10 % EX SWAB: 2.0000 "application " | Freq: Once | CUTANEOUS | Status: DC

## 2019-01-13 MED ORDER — MEPERIDINE HCL 50 MG/ML IJ SOLN: 6.2500 mg | INTRAMUSCULAR | Status: DC | PRN

## 2019-01-13 MED ORDER — HYDROCODONE-ACETAMINOPHEN 5-325 MG PO TABS: 1.0000 | ORAL_TABLET | ORAL | 0 refills | Status: AC | PRN

## 2019-01-13 MED ORDER — CEFAZOLIN SODIUM-DEXTROSE 2-4 GM/100ML-% IV SOLN
INTRAVENOUS | Status: AC
Start: 2019-01-13 — End: 2019-01-13
  Filled 2019-01-13: qty 100

## 2019-01-13 MED ORDER — ONDANSETRON HCL 4 MG/2ML IJ SOLN
INTRAMUSCULAR | Status: DC | PRN
  Administered 2019-01-13: 4 mg via INTRAVENOUS

## 2019-01-13 MED ORDER — FENTANYL CITRATE (PF) 100 MCG/2ML IJ SOLN
50.0000 ug | Freq: Once | INTRAMUSCULAR | Status: AC
  Administered 2019-01-13: 50 ug via INTRAVENOUS
  Filled 2019-01-13: qty 2

## 2019-01-13 MED ORDER — ONDANSETRON HCL 4 MG/2ML IJ SOLN
INTRAMUSCULAR | Status: AC
  Filled 2019-01-13: qty 2

## 2019-01-13 MED ORDER — CHLORHEXIDINE GLUCONATE 4 % EX LIQD
60.0000 mL | Freq: Once | CUTANEOUS | Status: DC
  Filled 2019-01-13: qty 60

## 2019-01-13 MED ORDER — BUPIVACAINE HCL (PF) 0.25 % IJ SOLN
INTRAMUSCULAR | Status: AC
  Filled 2019-01-13: qty 10

## 2019-01-13 MED ORDER — SUCCINYLCHOLINE CHLORIDE 200 MG/10ML IV SOSY
PREFILLED_SYRINGE | INTRAVENOUS | Status: DC | PRN
  Administered 2019-01-13: 160 mg via INTRAVENOUS

## 2019-01-13 MED ORDER — BUPIVACAINE HCL (PF) 0.25 % IJ SOLN
5.0000 mL | Freq: Once | INTRAMUSCULAR | Status: AC
  Administered 2019-01-13: 5 mL
  Filled 2019-01-13: qty 30

## 2019-01-13 MED ORDER — LACTATED RINGERS IV SOLN
INTRAVENOUS | Status: DC | PRN
  Administered 2019-01-13: 16:00:00 via INTRAVENOUS

## 2019-01-13 MED ORDER — BUPIVACAINE HCL 0.5 % IJ SOLN
5.0000 mL | Freq: Once | INTRAMUSCULAR | Status: DC
  Filled 2019-01-13: qty 5

## 2019-01-13 MED ORDER — SUGAMMADEX SODIUM 200 MG/2ML IV SOLN
INTRAVENOUS | Status: DC | PRN
  Administered 2019-01-13: 200 mg via INTRAVENOUS

## 2019-01-13 MED ORDER — ALBUTEROL SULFATE HFA 108 (90 BASE) MCG/ACT IN AERS
INHALATION_SPRAY | RESPIRATORY_TRACT | Status: DC | PRN
  Administered 2019-01-13 (×2): 4 via RESPIRATORY_TRACT

## 2019-01-13 MED ORDER — FENTANYL CITRATE (PF) 250 MCG/5ML IJ SOLN
INTRAMUSCULAR | Status: AC
  Filled 2019-01-13: qty 5

## 2019-01-13 MED ORDER — DEXAMETHASONE SODIUM PHOSPHATE 10 MG/ML IJ SOLN
INTRAMUSCULAR | Status: DC | PRN
  Administered 2019-01-13: 10 mg via INTRAVENOUS

## 2019-01-13 MED ORDER — CEFAZOLIN SODIUM-DEXTROSE 2-4 GM/100ML-% IV SOLN
2.0000 g | INTRAVENOUS | Status: AC
  Administered 2019-01-13: 16:00:00 2 g via INTRAVENOUS

## 2019-01-13 MED ORDER — DEXAMETHASONE SODIUM PHOSPHATE 10 MG/ML IJ SOLN
INTRAMUSCULAR | Status: AC
  Filled 2019-01-13: qty 1

## 2019-01-13 SURGICAL SUPPLY — 50 items
BANDAGE ACE 4X5 VEL STRL LF (GAUZE/BANDAGES/DRESSINGS) ×2 IMPLANT
BLADE 15 SAFETY STRL DISP (BLADE) ×1 IMPLANT
BNDG COHESIVE 1X5 TAN STRL LF (GAUZE/BANDAGES/DRESSINGS) ×1 IMPLANT
BNDG COHESIVE 2X5 TAN STRL LF (GAUZE/BANDAGES/DRESSINGS) ×1 IMPLANT
BNDG CONFORM 2 STRL LF (GAUZE/BANDAGES/DRESSINGS) ×1 IMPLANT
BNDG GAUZE ELAST 4 BULKY (GAUZE/BANDAGES/DRESSINGS) ×6 IMPLANT
CORDS BIPOLAR (ELECTRODE) ×2 IMPLANT
COVER SURGICAL LIGHT HANDLE (MISCELLANEOUS) ×2 IMPLANT
COVER WAND RF STERILE (DRAPES) ×2 IMPLANT
CUFF TOURNIQUET SINGLE 18IN (TOURNIQUET CUFF) ×2 IMPLANT
CUFF TOURNIQUET SINGLE 24IN (TOURNIQUET CUFF) IMPLANT
DRSG ADAPTIC 3X8 NADH LF (GAUZE/BANDAGES/DRESSINGS) ×2 IMPLANT
GAUZE SPONGE 4X4 12PLY STRL (GAUZE/BANDAGES/DRESSINGS) ×2 IMPLANT
GAUZE XEROFORM 1X8 LF (GAUZE/BANDAGES/DRESSINGS) ×2 IMPLANT
GLOVE BIO SURGEON STRL SZ7.5 (GLOVE) ×1 IMPLANT
GLOVE BIOGEL M 8.0 STRL (GLOVE) ×2 IMPLANT
GLOVE BIOGEL PI IND STRL 6.5 (GLOVE) IMPLANT
GLOVE BIOGEL PI IND STRL 7.5 (GLOVE) IMPLANT
GLOVE BIOGEL PI INDICATOR 6.5 (GLOVE) ×2
GLOVE BIOGEL PI INDICATOR 7.5 (GLOVE) ×1
GLOVE SS BIOGEL STRL SZ 8 (GLOVE) ×1 IMPLANT
GLOVE SUPERSENSE BIOGEL SZ 8 (GLOVE) ×1
GLOVE SURG SYN 7.5  E (GLOVE) ×2
GLOVE SURG SYN 7.5 E (GLOVE) ×2 IMPLANT
GLOVE SURG SYN 7.5 PF PI (GLOVE) IMPLANT
GOWN STRL REUS W/ TWL LRG LVL3 (GOWN DISPOSABLE) ×1 IMPLANT
GOWN STRL REUS W/ TWL XL LVL3 (GOWN DISPOSABLE) ×2 IMPLANT
GOWN STRL REUS W/TWL LRG LVL3 (GOWN DISPOSABLE) ×1
GOWN STRL REUS W/TWL XL LVL3 (GOWN DISPOSABLE) ×2
KIT BASIN OR (CUSTOM PROCEDURE TRAY) ×2 IMPLANT
KIT TURNOVER KIT B (KITS) ×2 IMPLANT
MANIFOLD NEPTUNE II (INSTRUMENTS) ×2 IMPLANT
NDL HYPO 25GX1X1/2 BEV (NEEDLE) IMPLANT
NEEDLE HYPO 25GX1X1/2 BEV (NEEDLE) IMPLANT
NS IRRIG 1000ML POUR BTL (IV SOLUTION) ×2 IMPLANT
PACK ORTHO EXTREMITY (CUSTOM PROCEDURE TRAY) ×2 IMPLANT
PAD ARMBOARD 7.5X6 YLW CONV (MISCELLANEOUS) ×2 IMPLANT
PAD CAST 4YDX4 CTTN HI CHSV (CAST SUPPLIES) ×1 IMPLANT
PADDING CAST COTTON 4X4 STRL (CAST SUPPLIES) ×1
SCRUB BETADINE 4OZ XXX (MISCELLANEOUS) ×2 IMPLANT
SET CYSTO W/LG BORE CLAMP LF (SET/KITS/TRAYS/PACK) ×2 IMPLANT
SOL PREP POV-IOD 4OZ 10% (MISCELLANEOUS) ×2 IMPLANT
SPONGE LAP 4X18 RFD (DISPOSABLE) ×2 IMPLANT
SWAB CULTURE ESWAB REG 1ML (MISCELLANEOUS) IMPLANT
SYR CONTROL 10ML LL (SYRINGE) IMPLANT
TOWEL OR 17X24 6PK STRL BLUE (TOWEL DISPOSABLE) ×1 IMPLANT
TOWEL OR 17X26 10 PK STRL BLUE (TOWEL DISPOSABLE) ×2 IMPLANT
TUBE CONNECTING 12X1/4 (SUCTIONS) ×2 IMPLANT
WATER STERILE IRR 1000ML POUR (IV SOLUTION) ×2 IMPLANT
YANKAUER SUCT BULB TIP NO VENT (SUCTIONS) ×2 IMPLANT

## 2019-01-13 NOTE — Transfer of Care (Signed)
Immediate Anesthesia Transfer of Care Note  Patient: Jason Li  Procedure(s) Performed: Revision of Amputation, IRRIGATION and Debridement Left ring finger. (Left Hand)  Patient Location: PACU  Anesthesia Type:General  Level of Consciousness: awake, alert  and oriented  Airway & Oxygen Therapy: Patient Spontanous Breathing and Patient connected to face mask oxygen  Post-op Assessment: Report given to RN and Post -op Vital signs reviewed and stable  Post vital signs: Reviewed and stable  Last Vitals:  Vitals Value Taken Time  BP 120/81 01/13/2019  5:00 PM  Temp    Pulse 95 01/13/2019  5:06 PM  Resp 39 01/13/2019  5:06 PM  SpO2 97 % 01/13/2019  5:06 PM  Vitals shown include unvalidated device data.  Last Pain:  Vitals:   01/13/19 1500  TempSrc: Oral  PainSc:          Complications: No apparent anesthesia complications

## 2019-01-13 NOTE — ED Provider Notes (Signed)
[facility] Provider Note   CSN: [id number] Arrival date & time: 01/13/19  1225    History   Chief Complaint Chief Complaint  Patient presents with  . Finger Injury    HPI Jason Li is a 38 y.o. male.     The history is provided by the patient. No language interpreter was used.   Jason Li is a 38 y.o. male who presents to the Emergency Department complaining of finger injury. He presents to the emergency department after injuring his finger at work. He states that he was emptying a hopper and it went to fall and he put his hand out to stop it. The tip of his left ring finger was cut off. He pondered and on ice. This happened about 45 minutes prior to ED arrival. He only has mild pain at the site and states it is otherwise numb. She denies any medical problems and takes no medications. He has no allergies to medications. Tetanus status is unknown.  He is right hand dominant.   History reviewed. No pertinent past medical history.  There are no active problems to display for this patient.   History reviewed. No pertinent surgical history.      Home Medications    Prior to Admission medications   Medication Sig Start Date End Date Taking? Authorizing Provider  Phenyleph-Doxylamine-DM-APAP (ALKA-SELTZER PLS NIGHT CLD/FLU PO) Take 1 tablet by mouth at bedtime as needed (cold symptoms).   Yes [provider]    Family History History reviewed. No pertinent family history.  Social History Social History   Tobacco Use  . Smoking status: Current Every Day Smoker    Packs/day: 0.50    Years: 20.00    Pack years: 10.00    Types: Cigarettes  . Smokeless tobacco: Never Used  Substance Use Topics  . Alcohol use: Yes    Alcohol/week: 3.0 standard drinks    Types: 3 Cans of beer per week    Comment: daily- 3 drinks/day  . Drug use: No     Allergies   Patient has no known allergies.   Review of Systems  Review of Systems  All other systems reviewed and are negative.    Physical Exam Updated Vital Signs BP 119/79   Pulse 77   Temp 97.9 F (36.6 C) (Oral)   Resp 16   Ht 5\' 5"  (1.651 m)   Wt 90.7 kg   SpO2 99%   BMI 33.28 kg/m   Physical Exam Vitals signs and nursing note reviewed.  Constitutional:      Appearance: Normal appearance.  HENT:     Head: Normocephalic and atraumatic.  Neck:     Musculoskeletal: Neck supple.  Cardiovascular:     Rate and Rhythm: Normal rate and regular rhythm.  Pulmonary:     Effort: Pulmonary effort is normal. No respiratory distress.  Musculoskeletal:     Comments: 2+ radial pulses bilaterally. The left fourth digit has a distal finger amputation just past the D IP joint. It is hemostatic. Flexion extension is intact at the PIP joint. Sensation to light touch is intact throughout the digit.  Skin:    General: Skin is warm and dry.     Capillary Refill: Capillary refill takes less than 2 seconds.  Neurological:     Mental Status: He is alert and oriented to person, place, and time.  Psychiatric:        Mood and Affect: Mood normal.  Behavior: Behavior normal.      ED Treatments / Results  Labs (all labs ordered are listed, but only abnormal results are displayed) Labs Reviewed - No data to display  EKG None  Radiology Dg Finger Ring Left  Result Date: 01/13/2019 CLINICAL DATA:  Left ring finger injury in a trash compacter today. Initial encounter. EXAM: LEFT RING FINGER 2+V COMPARISON:  None. FINDINGS: The patient suffered amputation of the left ring finger at the level of the base of the distal phalanx. The DIP joint is not involved. No radiopaque foreign body. No other abnormality is seen. IMPRESSION: Amputation at the level of the base of the distal phalanx of the left ring finger. Electronically Signed   By: Drusilla Kanner M.D.   On: 01/13/2019 13:21    Procedures Procedures (including critical care time)   Medications Ordered in ED Medications  chlorhexidine (HIBICLENS) 4 % liquid 4 application (has no administration in time range)  povidone-iodine 10 % swab 2 application (has no administration in time range)  ceFAZolin (ANCEF) IVPB 2g/100 mL premix (has no administration in time range)  ceFAZolin (ANCEF) 2-4 GM/100ML-% IVPB (has no administration in time range)  lactated ringers infusion (has no administration in time range)  fentaNYL (SUBLIMAZE) injection 50 mcg (50 mcg Intravenous Given 01/13/19 1257)  Tdap (BOOSTRIX) injection 0.5 mL (0.5 mLs Intramuscular Given 01/13/19 1300)  bupivacaine (PF) (MARCAINE) 0.25 % injection 5 mL (5 mLs Infiltration Given 01/13/19 1345)     Initial Impression / Assessment and Plan / ED Course  I have reviewed the triage vital signs and the nursing notes.  Pertinent labs & imaging results that were available during my care of the patient were reviewed by me and considered in my medical decision making (see chart for details).        Patient here for evaluation of fingertip amputation. He was evaluated by orthopedics in the emergency department with plan to take to the OR for revision.    Final Clinical Impressions(s) / ED Diagnoses   Final diagnoses:  Fingertip amputation, initial encounter    ED Discharge Orders    None       Tilden Fossa, MD 01/13/19 1501

## 2019-01-13 NOTE — Anesthesia Procedure Notes (Signed)
Procedure Name: Intubation Date/Time: 01/13/2019 3:56 PM Performed by: Zollie Scale, CRNA Pre-anesthesia Checklist: Patient identified, Emergency Drugs available, Suction available and Patient being monitored Patient Re-evaluated:Patient Re-evaluated prior to induction Oxygen Delivery Method: Circle System Utilized Preoxygenation: Pre-oxygenation with 100% oxygen Induction Type: IV induction and Rapid sequence Laryngoscope Size: Glidescope and 4 Grade View: Grade I Tube type: Oral Tube size: 7.5 mm Number of attempts: 1 Airway Equipment and Method: Stylet and Oral airway Placement Confirmation: ETT inserted through vocal cords under direct vision,  positive ETCO2 and breath sounds checked- equal and bilateral Secured at: 22 cm Tube secured with: Tape Dental Injury: Teeth and Oropharynx as per pre-operative assessment  Comments: Glidescope utilized d/t s/s of rhinorrhea, and COVID19 precautions.

## 2019-01-13 NOTE — ED Notes (Signed)
Pt changing into gown now. Hand PA has consent form.

## 2019-01-13 NOTE — Op Note (Signed)
PREOPERATIVE DIAGNOSIS: Left ring finger partial amputation  POSTOPERATIVE DIAGNOSIS: Same  ATTENDING PHYSICIAN: Gasper Lloyd. Roney Mans, III, MD who was present and scrubbed for the entire case   ASSISTANT SURGEON: None.   ANESTHESIA: General  SURGICAL PROCEDURES: 1.  Irrigation debridement of skin, subcutaneous tissue and bone 2.  Revision amputation left ring finger  SURGICAL INDICATIONS: Patient is a 38 year old male who was at work earlier today.  He got the tip of the left ring finger caught in a trash compactor which amputated the tip.  He was seen in the ER where he was evaluated.  Radiographs showed amputation through the distal phalanx with a small remaining piece proximally.  We did recommend proceeding to surgery for a revision amputation of the left ring finger.  FINDING: Complete amputation of the tip of the ring finger through the distal phalanx.  There was a small amount of gross contamination in the soft tissues primarily along the volar wound.  After removing the remaining distal phalanx there is a tension-free closure of his ring finger revision amputation as a DIP disarticulation.  DESCRIPTION OF PROCEDURE: Patient was identified in the preoperative holding area where the risk benefits and alternatives of the procedure were discussed with the patient.  These include but are not limited to infection, bleeding, damage to surrounding structures including blood vessels and nerves, pain, stiffness or neuroma formation and need for additional procedures.  Informed consent was obtained at that time and the patient's left ring finger was marked with surgical marking pen.  He was then brought back to the operative suite where a timeout was performed identifying the correct patient operative site.  He was positioned supine operative table and induced under general endotracheal anesthesia.  Tourniquet is placed on the upper arm and the arm was then prepped and draped in usual sterile  fashion.  The limb was exsanguinated and the tourniquet was inflated.  I began by thoroughly debriding the amputation through the distal phalanx of the ring finger.  There was obvious gross contamination along the volar wound.  This involves primarily the soft tissues.  The wound was copiously irrigated with normal saline and debrided with both curette and rongeurs.  This included skin, subcutaneous tissue and bone.  Once all gross contamination was removed 15 blade was used to sharply dissect around the remaining distal phalanx.  The flexor and extensor tendons were transected and the DIP joint was disarticulated removing the remaining distal phalanx.  The digital neurovascular bundles were cauterized.  The wound was then once again copiously irrigated with normal saline.  The volar skin was gently mobilized and advanced over the middle phalanx.  4-0 Prolene sutures were used to close the skin in a tension-free manner.  Dogears were resected using a 15 blade and scissors till a well contoured tension-free closure was obtained.  The wound was dressed with Xeroform, 4 x 4's, Kling and a small plaster splint to the tip of the digit.  The tourniquet was released and the patient was awoken from his anesthesia.  He was extubated in the operating room without a complications.  He was taken to the PACU in stable condition.   ESTIMATED BLOOD LOSS: Less than 10 mL's  TOURNIQUET TIME: 22 minutes  SPECIMENS: None  POSTOPERATIVE PLAN: The patient will be discharged home and seen back  in the office in approximately 10-12 days for wound check, suture  removal, and then be sent to a therapist for range of motion of the digit, tip protector  splint and modalities as indicated  IMPLANTS: None

## 2019-01-13 NOTE — ED Notes (Signed)
Pt to xray

## 2019-01-13 NOTE — ED Triage Notes (Signed)
Pt was working today, had the tip of his left ring finger cut off by the trash compactor around 1200, bleeding controlled.  Pt given 50 mcg fentanyl by EMS.

## 2019-01-13 NOTE — Discharge Instructions (Signed)
Discharge Instructions ° °- Keep dressings in place. Do not remove them. °- The dressings must stay dry °- Take all medication as prescribed. Transition to over the counter pain medication as your pain improves °- Keep the hand elevated over the next 48-72 hours to help with pain and swelling °- Move all digits not restricted by the dressings regularly to prevent stiffness °- Please call to schedule a follow up appointment with Dr. Jaliana Medellin at [phone] for 10-14 days following surgery °

## 2019-01-13 NOTE — Consult Note (Signed)
Reason for Consult:Left ring finger amputation Referring Physician: [name] is an 38 y.o. male.  HPI: Jason Li was working today and got his left ring finger caught in a Immunologist and had the tip of it amputated. He came to the ED for evaluation and hand surgery was consulted. He is RHD.  History reviewed. No pertinent past medical history.  History reviewed. No pertinent surgical history.  History reviewed. No pertinent family history.  Social History:  reports that he has been smoking cigarettes. He has a 10.00 pack-year smoking history. He has never used smokeless tobacco. He reports current alcohol use of about 3.0 standard drinks of alcohol per week. He reports that he does not use drugs.  Allergies: No Known Allergies  Medications: I have reviewed the patient's current medications.  No results found for this or any previous visit (from the past 48 hour(s)).  Dg Finger Ring Left  Result Date: 01/13/2019 CLINICAL DATA:  Left ring finger injury in a trash compacter today. Initial encounter. EXAM: LEFT RING FINGER 2+V COMPARISON:  None. FINDINGS: The patient suffered amputation of the left ring finger at the level of the base of the distal phalanx. The DIP joint is not involved. No radiopaque foreign body. No other abnormality is seen. IMPRESSION: Amputation at the level of the base of the distal phalanx of the left ring finger. Electronically Signed   By: Drusilla Kanner M.D.   On: 01/13/2019 13:21    Review of Systems  Constitutional: Negative for weight loss.  HENT: Negative for ear discharge, ear pain, hearing loss and tinnitus.   Eyes: Negative for blurred vision, double vision, photophobia and pain.  Respiratory: Negative for cough, sputum production and shortness of breath.   Cardiovascular: Negative for chest pain.  Gastrointestinal: Negative for abdominal pain, nausea and vomiting.  Genitourinary: Negative for dysuria, flank pain, frequency and  urgency.  Musculoskeletal: Positive for joint pain (Left ring finger). Negative for back pain, falls, myalgias and neck pain.  Neurological: Negative for dizziness, tingling, sensory change, focal weakness, loss of consciousness and headaches.  Endo/Heme/Allergies: Does not bruise/bleed easily.  Psychiatric/Behavioral: Negative for depression, memory loss and substance abuse. The patient is not nervous/anxious.    Blood pressure (!) 136/94, pulse 85, temperature 97.9 F (36.6 C), temperature source Oral, resp. rate 18, height 5\' 5"  (1.651 m), weight 90.7 kg, SpO2 99 %. Physical Exam  Constitutional: He appears well-developed and well-nourished. No distress.  HENT:  Head: Normocephalic and atraumatic.  Eyes: Conjunctivae are normal. Right eye exhibits no discharge. Left eye exhibits no discharge. No scleral icterus.  Neck: Normal range of motion.  Cardiovascular: Normal rate and regular rhythm.  Respiratory: Effort normal. No respiratory distress.  Musculoskeletal:     Comments: Left shoulder, elbow, wrist, digits- Complete amputation P3 ring finger, no instability, no blocks to motion  Sens  Ax/R/M/U intact  Mot   Ax/ R/ PIN/ M/ AIN/ U intact  Rad 2+  Neurological: He is alert.  Skin: Skin is warm and dry. He is not diaphoretic.  Psychiatric: He has a normal mood and affect. His behavior is normal.    Assessment/Plan: Left ring finger amputation -- For amputation revision by Dr. Roney Mans later today. NPO. Anticipate discharge after surgery.    Freeman Caldron, PA-C Orthopedic Surgery [phone] 01/13/2019, 1:36 PM

## 2019-01-13 NOTE — Anesthesia Postprocedure Evaluation (Signed)
Anesthesia Post Note  Patient: Jason Li  Procedure(s) Performed: Revision of Amputation, IRRIGATION and Debridement Left ring finger. (Left Hand)     Patient location during evaluation: PACU Anesthesia Type: General Level of consciousness: awake and alert, patient cooperative and oriented Pain management: pain level controlled Vital Signs Assessment: post-procedure vital signs reviewed and stable Respiratory status: spontaneous breathing, nonlabored ventilation and respiratory function stable Cardiovascular status: blood pressure returned to baseline and stable Postop Assessment: no apparent nausea or vomiting and able to ambulate Anesthetic complications: no    Last Vitals:  Vitals:   01/13/19 1700 01/13/19 1715  BP: 120/81 112/68  Pulse: 93 85  Resp: 20 20  Temp: (!) 36.4 C (!) 36.3 C  SpO2: 100% 96%    Last Pain:  Vitals:   01/13/19 1700  TempSrc:   PainSc: 0-No pain                 Kirkland Figg,E. Derica Leiber

## 2019-01-13 NOTE — Anesthesia Preprocedure Evaluation (Signed)
Anesthesia Evaluation  Patient identified by MRN, date of birth, ID band Patient awake    Reviewed: Allergy & Precautions, NPO status , Patient's Chart, lab work & pertinent test results  History of Anesthesia Complications Negative for: history of anesthetic complications  Airway Mallampati: I  TM Distance: >3 FB Neck ROM: Full    Dental  (+) Dental Advisory Given   Pulmonary Recent URI , Current Smoker,    breath sounds clear to auscultation       Cardiovascular negative cardio ROS   Rhythm:Regular Rate:Normal     Neuro/Psych negative neurological ROS     GI/Hepatic Neg liver ROS, GERD  Poorly Controlled,  Endo/Other  obesity  Renal/GU negative Renal ROS     Musculoskeletal   Abdominal (+) + obese,   Peds  Hematology negative hematology ROS (+)   Anesthesia Other Findings   Reproductive/Obstetrics                             Anesthesia Physical Anesthesia Plan  ASA: II and emergent  Anesthesia Plan: General   Post-op Pain Management:    Induction: Intravenous and Rapid sequence  PONV Risk Score and Plan: 1 and Ondansetron  Airway Management Planned: Oral ETT  Additional Equipment:   Intra-op Plan:   Post-operative Plan: Extubation in OR  Informed Consent: I have reviewed the patients History and Physical, chart, labs and discussed the procedure including the risks, benefits and alternatives for the proposed anesthesia with the patient or authorized representative who has indicated his/her understanding and acceptance.     Dental advisory given  Plan Discussed with: CRNA and Surgeon  Anesthesia Plan Comments: (Plan routine monitors, GETA)        Anesthesia Quick Evaluation

## 2019-01-13 NOTE — ED Notes (Signed)
Pt's wallet given to pt's wife per pt's request

## 2019-01-13 NOTE — ED Notes (Signed)
Pt back from x-ray.

## 2019-01-14 ENCOUNTER — Encounter (HOSPITAL_COMMUNITY): Payer: Self-pay | Admitting: Orthopaedic Surgery

## 2020-02-02 ENCOUNTER — Ambulatory Visit: Payer: Self-pay | Attending: Internal Medicine

## 2020-02-02 DIAGNOSIS — Z23 Encounter for immunization: Secondary | ICD-10-CM

## 2020-02-02 NOTE — Progress Notes (Signed)
   Covid-19 Vaccination Clinic  Name:  Jason Li    MRN: [id number] DOB: Jul 12, 1981  02/02/2020  [name] was observed post Covid-19 immunization for 15 minutes without incident. He was provided with Vaccine Information Sheet and instruction to access the V-Safe system.   [name] was instructed to call 911 with any severe reactions post vaccine: Marland Kitchen Difficulty breathing  . Swelling of face and throat  . A fast heartbeat  . A bad rash all over body  . Dizziness and weakness   Immunizations Administered    Name Date Dose VIS Date Route   Pfizer COVID-19 Vaccine 02/02/2020  2:15 PM 0.3 mL 11/29/2018 Intramuscular   Manufacturer: [employer], [employer]   Lot: [id number]   NDC: [address]-0

## 2020-02-26 ENCOUNTER — Ambulatory Visit: Payer: Self-pay | Attending: Internal Medicine

## 2020-07-08 IMAGING — CR LEFT RING FINGER 2+V
3 series · 3 of 3 positions shown · non-contrast
Comparison: None.

CLINICAL DATA: Left ring finger injury in a trash compacter today.
Initial encounter.

EXAM:
LEFT RING FINGER 2+V

[finger ap]
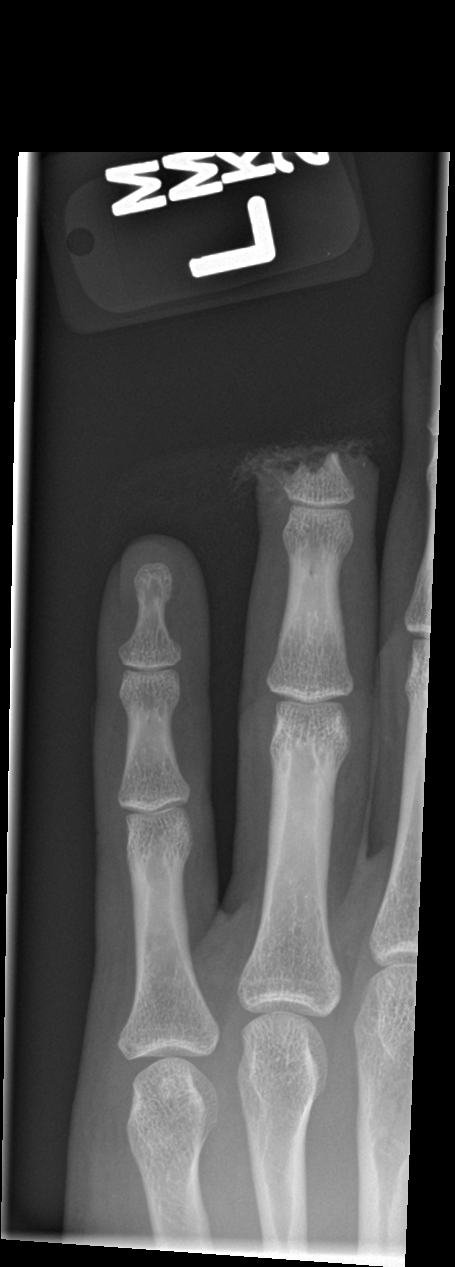

[finger obl]
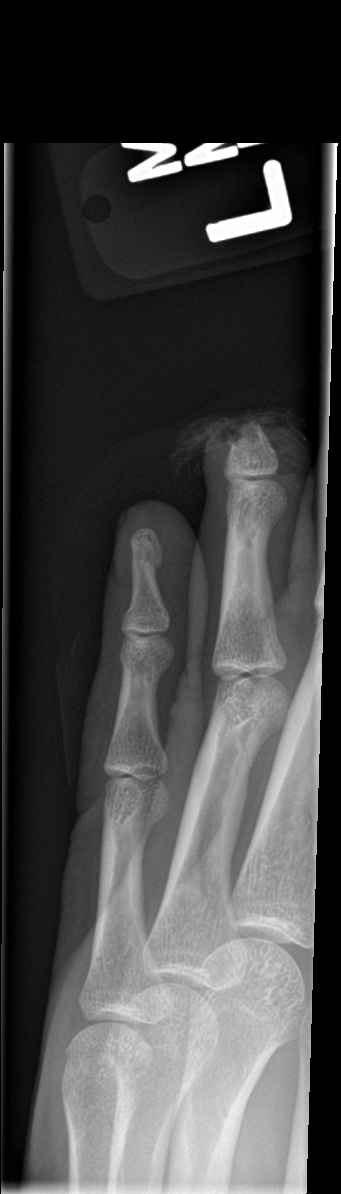

[finger lat]
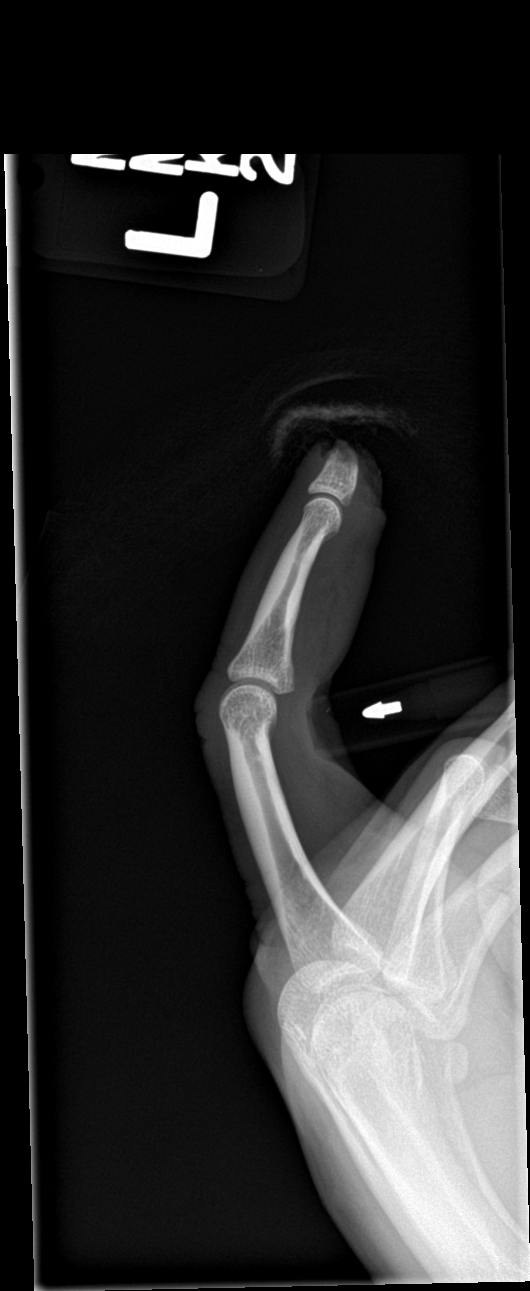

[3 of 3 positions shown; findings below may reference images not displayed]

FINDINGS: The patient suffered amputation of the left ring finger at the level
of the base of the distal phalanx. The DIP joint is not involved. No
radiopaque foreign body. No other abnormality is seen.
IMPRESSION: Amputation at the level of the base of the distal phalanx of the
left ring finger.

## 2021-07-09 ENCOUNTER — Other Ambulatory Visit: Payer: Self-pay

## 2021-07-09 ENCOUNTER — Encounter (HOSPITAL_COMMUNITY): Payer: Self-pay

## 2021-07-09 ENCOUNTER — Ambulatory Visit (HOSPITAL_COMMUNITY)
Admission: EM | Admit: 2021-07-09 | Discharge: 2021-07-09 | Disposition: A | Discharge status: Home / Self Care | Payer: Self-pay | Attending: Emergency Medicine | Admitting: Emergency Medicine

## 2021-07-09 DIAGNOSIS — Z833 Family history of diabetes mellitus: Secondary | ICD-10-CM | POA: Insufficient documentation

## 2021-07-09 DIAGNOSIS — R350 Frequency of micturition: Secondary | ICD-10-CM | POA: Insufficient documentation

## 2021-07-09 DIAGNOSIS — R631 Polydipsia: Secondary | ICD-10-CM | POA: Insufficient documentation

## 2021-07-09 DIAGNOSIS — R42 Dizziness and giddiness: Secondary | ICD-10-CM | POA: Insufficient documentation

## 2021-07-09 DIAGNOSIS — R5383 Other fatigue: Secondary | ICD-10-CM | POA: Insufficient documentation

## 2021-07-09 LAB — COMPREHENSIVE METABOLIC PANEL
ALT: 28 U/L (ref 0–44)
AST: 21 U/L (ref 15–41)
Albumin: 4 g/dL (ref 3.5–5.0)
Alkaline Phosphatase: 75 U/L (ref 38–126)
Anion gap: 8 (ref 5–15)
BUN: 14 mg/dL (ref 6–20)
CO2: 26 mmol/L (ref 22–32)
Calcium: 9 mg/dL (ref 8.9–10.3)
Chloride: 102 mmol/L (ref 98–111)
Creatinine, Ser: 1.19 mg/dL (ref 0.61–1.24)
GFR, Estimated: >60 mL/min (ref >60)
Glucose, Bld: 85 mg/dL (ref 70–99)
Potassium: 4.5 mmol/L (ref 3.5–5.1)
Sodium: 136 mmol/L (ref 135–145)
Total Bilirubin: 0.8 mg/dL (ref 0.3–1.2)
Total Protein: 7.2 g/dL (ref 6.5–8.1)

## 2021-07-09 LAB — POCT URINALYSIS DIPSTICK, ED / UC
Bilirubin Urine: NEGATIVE
Glucose, UA: NEGATIVE mg/dL
Hgb urine dipstick: NEGATIVE
Ketones, ur: NEGATIVE mg/dL
Leukocytes,Ua: NEGATIVE
Nitrite: NEGATIVE
Protein, ur: NEGATIVE mg/dL
Specific Gravity, Urine: 1.025 (ref 1.005–1.030)
Urobilinogen, UA: 0.2 mg/dL (ref 0.0–1.0)
pH: 6.5 (ref 5.0–8.0)

## 2021-07-09 LAB — CBC WITH DIFFERENTIAL/PLATELET
Abs Immature Granulocytes: 0.06 10*3/uL (ref 0.00–0.07)
Basophils Absolute: 0.1 10*3/uL (ref 0.0–0.1)
Basophils Relative: 1 %
Eosinophils Absolute: 0.2 10*3/uL (ref 0.0–0.5)
Eosinophils Relative: 2 %
HCT: 47.6 % (ref 39.0–52.0)
Hemoglobin: 15.8 g/dL (ref 13.0–17.0)
Immature Granulocytes: 1 %
Lymphocytes Relative: 21 %
Lymphs Abs: 2 10*3/uL (ref 0.7–4.0)
MCH: 28.5 pg (ref 26.0–34.0)
MCHC: 33.2 g/dL (ref 30.0–36.0)
MCV: 85.9 fL (ref 80.0–100.0)
Monocytes Absolute: 0.9 10*3/uL (ref 0.1–1.0)
Monocytes Relative: 9 %
Neutro Abs: 6.3 10*3/uL (ref 1.7–7.7)
Neutrophils Relative %: 66 %
Platelets: 223 10*3/uL (ref 150–400)
RBC: 5.54 MIL/uL (ref 4.22–5.81)
RDW: 14.1 % (ref 11.5–15.5)
WBC: 9.6 10*3/uL (ref 4.0–10.5)
nRBC: 0 % (ref 0.0–0.2)

## 2021-07-09 LAB — HEMOGLOBIN A1C
Hgb A1c MFr Bld: 5.8 % — ABNORMAL HIGH (ref 4.8–5.6)
Mean Plasma Glucose: 119.76 mg/dL

## 2021-07-09 LAB — CBG MONITORING, ED: Glucose-Capillary: 93 mg/dL (ref 70–99)

## 2021-07-09 NOTE — ED Triage Notes (Signed)
Pt presents with back pain and dizziness X 2 days.

## 2021-07-09 NOTE — Discharge Instructions (Addendum)
Your EKG today was abnormal, it demonstrated possible enlargement of the left lower half of your heart which is your ventricle, this finding is often due to chronically elevated blood pressure.  Your EKG also demonstrated, which is more concerning, history of a possible heart attack on the front side of your heart.  I recommend that you find a primary care provider and discuss these findings with them, I provided you with a copy of your EKG today.  The results of your blood test should be back within 24 hours, you will be notified of the results once they are received.  Your urinalysis today was normal and your blood sugar was 93 which is also considered normal.  In the meantime, continue to stay well-hydrated, eat a low-carb high-protein diet and speak with your wife about possibly taking turns staying up with the baby so that at least you are getting a full night sleep every other night.

## 2021-07-09 NOTE — ED Provider Notes (Signed)
[facility]    CSN: [id number] Arrival date & time: 07/09/21  1548      History   Chief Complaint Chief Complaint  Patient presents with   Back Pain   Dizziness    HPI Jason Li is a 40 y.o. male.   Patient complains of back pain and dizziness for 2 days.  Patient's blood pressure is mildly elevated on arrival today.  Patient states he has stumbled a few times and has noticed that his vision has been a little bit blurry.  Patient denies headache.  Patient states he has a 60-month-old baby at home, does not sleep more than 3 hours at a stretch most nights.  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  Additionally patient states has been urinating more frequently lately.  Patient states both his mother and his father are type II diabetics.  Patient denies a history of heart disease.  Patient states he is appetite has been normal but admits to being aware that he is overweight.  Patient states that he smokes approximately a half a pack a day, used to smoke a pack per day but is been working on cutting back.  Patient states 24 ounce beers several nights a week after work.  Patient denies chest pain, shortness of breath, nausea, vomiting, diarrhea.  Patient states he is also noticed that he has been having intermittent back pain that is bilateral in his lumbar spine, states ibuprofen provides relief.  The history is provided by the patient.   History reviewed. No pertinent past medical history.  There are no problems to display for this patient.   Past Surgical History:  Procedure Laterality Date   I & D EXTREMITY Left 01/13/2019   Procedure: Revision of Amputation, IRRIGATION and Debridement Left ring finger.;  Surgeon: Ernest Mallick, MD;  Location: [facility] OR;  Service: Orthopedics;  Laterality: Left;       Home Medications    Prior  to Admission medications   Medication Sig Start Date End Date Taking? Authorizing Provider  Phenyleph-Doxylamine-DM-APAP (ALKA-SELTZER PLS NIGHT CLD/FLU PO) Take 1 tablet by mouth at bedtime as needed (cold symptoms).    [provider]    Family History History reviewed. No pertinent family history.  Social History Social History   Tobacco Use   Smoking status: Every Day    Packs/day: 0.50    Years: 20.00    Pack years: 10.00    Types: Cigarettes   Smokeless tobacco: Never  Substance Use Topics   Alcohol use: Yes    Alcohol/week: 3.0 standard drinks    Types: 3 Cans of beer per week    Comment: daily- 3 drinks/day   Drug use: No     Allergies   Patient has no known allergies.   Review of Systems Review of Systems Pertinent findings noted in history of present illness.    Physical Exam Triage Vital Signs ED Triage Vitals  Enc Vitals Group     BP 07/09/21 1632 (!) 141/92     Pulse Rate 07/09/21 1632 91     Resp 07/09/21 1632 18     Temp 07/09/21 1632 98.8 F (37.1 C)     Temp Source 07/09/21 1632 Oral     SpO2 07/09/21 1632 98 %     Weight --  Height --      Head Circumference --      Peak Flow --      Pain Score 07/09/21 1634 7     Pain Loc --      Pain Edu? --      Excl. in GC? --    No data found.  Updated Vital Signs BP (!) 141/92 (BP Location: Left Arm)   Pulse 91   Temp 98.8 F (37.1 C) (Oral)   Resp 18   SpO2 98%   Visual Acuity Right Eye Distance:   Left Eye Distance:   Bilateral Distance:    Right Eye Near:   Left Eye Near:    Bilateral Near:     Physical Exam Vitals and nursing note reviewed.  Constitutional:      Appearance: Normal appearance.  HENT:     Head: Normocephalic and atraumatic.     Right Ear: Tympanic membrane, ear canal and external ear normal.     Left Ear: Tympanic membrane, ear canal and external ear normal.     Nose: Nose normal.     Mouth/Throat:     Mouth: Mucous membranes are moist.      Pharynx: Oropharynx is clear.  Eyes:     Extraocular Movements: Extraocular movements intact.     Conjunctiva/sclera: Conjunctivae normal.     Pupils: Pupils are equal, round, and reactive to light.  Cardiovascular:     Rate and Rhythm: Normal rate and regular rhythm.     Heart sounds: Normal heart sounds.  Pulmonary:     Effort: Pulmonary effort is normal.     Breath sounds: Normal breath sounds.  Abdominal:     General: Abdomen is flat. Bowel sounds are normal.     Palpations: Abdomen is soft.  Musculoskeletal:        General: Normal range of motion.     Cervical back: Normal range of motion and neck supple.  Skin:    General: Skin is warm and dry.  Neurological:     General: No focal deficit present.     Mental Status: He is alert and oriented to person, place, and time.  Psychiatric:        Mood and Affect: Mood normal.        Behavior: Behavior normal.     UC Treatments / Results  Labs (all labs ordered are listed, but only abnormal results are displayed) Labs Reviewed  CBC WITH DIFFERENTIAL/PLATELET  COMPREHENSIVE METABOLIC PANEL  HEMOGLOBIN A1C  POCT URINALYSIS DIPSTICK, ED / UC  CBG MONITORING, ED    EKG   Radiology No results found.  Procedures Procedures (including critical care time)  Medications Ordered in UC Medications - No data to display  Initial Impression / Assessment and Plan / UC Course  I have reviewed the triage vital signs and the nursing notes.  Pertinent labs & imaging results that were available during my care of the patient were reviewed by me and considered in my medical decision making (see chart for details).     EKG today was concerning for possible anterior infarct age undetermined as well as possible left atrial enlargement.  She was advised.  Patient had normal blood sugar and normal urinalysis today.  Patient was advised to obtain a primary care provider and follow-up with them soon as possible for possible referral to  cardiology.  Please see discharge directions below for further details of care plan. Patient verbalized understanding and agreement of plan as discussed.  All questions were addressed during visit.  Final Clinical Impressions(s) / UC Diagnoses   Final diagnoses:  Dizzy  Increased frequency of urination  Increased thirst  Family history of diabetes mellitus  Other fatigue     Discharge Instructions      Your EKG today was abnormal, it demonstrated possible enlargement of the left lower half of your heart which is your ventricle, this finding is often due to chronically elevated blood pressure.  Your EKG also demonstrated, which is more concerning, history of a possible heart attack on the front side of your heart.  I recommend that you find a primary care provider and discuss these findings with them, I provided you with a copy of your EKG today.  The results of your blood test should be back within 24 hours, you will be notified of the results once they are received.  Your urinalysis today was normal and your blood sugar was 93 which is also considered normal.  In the meantime, continue to stay well-hydrated, eat a low-carb high-protein diet and speak with your wife about possibly taking turns staying up with the baby so that at least you are getting a full night sleep every other night.     ED Prescriptions   None    PDMP not reviewed this encounter.   Theadora Rama Scales, PA-C 07/09/21 1835

## 2021-07-18 ENCOUNTER — Other Ambulatory Visit: Payer: Self-pay

## 2021-07-18 ENCOUNTER — Encounter: Payer: Self-pay | Admitting: Nurse Practitioner

## 2021-07-18 ENCOUNTER — Ambulatory Visit (INDEPENDENT_AMBULATORY_CARE_PROVIDER_SITE_OTHER): Payer: Self-pay | Admitting: Nurse Practitioner

## 2021-07-18 VITALS — BP 143/95 | HR 75 | Temp 98.6°F | Ht 66.0 in | Wt 200.0 lb

## 2021-07-18 DIAGNOSIS — Z720 Tobacco use: Secondary | ICD-10-CM

## 2021-07-18 DIAGNOSIS — I10 Essential (primary) hypertension: Secondary | ICD-10-CM

## 2021-07-18 DIAGNOSIS — Z0000 Encounter for general adult medical examination without abnormal findings: Secondary | ICD-10-CM

## 2021-07-18 MED ORDER — AMLODIPINE BESYLATE 10 MG PO TABS: 10.0000 mg | ORAL_TABLET | Freq: Every day | ORAL | 0 refills | Status: DC

## 2021-07-18 MED ORDER — NICOTINE 14 MG/24HR TD PT24: 14.0000 mg | MEDICATED_PATCH | Freq: Every day | TRANSDERMAL | 0 refills | Status: DC

## 2021-07-18 NOTE — Patient Instructions (Signed)
You were seen today in the [facility] for hospital/ urgent care follow up. Labs were collected, results will be available via MyChart or, if abnormal, you will be contacted by clinic staff. You were prescribed medications, please take as directed. Please follow up in 1 mth for reevaluation of hypertension.

## 2021-07-18 NOTE — Progress Notes (Signed)
[facility] Patient [facility] [address] [place], [place]  [address] Phone:  [phone]   Fax:  [phone] Subjective:   Patient ID: Jason Li, male    DOB: Jan 18, 1981, 40 y.o.   MRN: [id number]  No chief complaint on file.  HPI Jason Li 40 y.o. male with no significant medical history to the [facility] to establish care. Patient states that he was evaluated in UC 1 wk ago for dizziness and informed he had a mild heart attack. States that he has had intermittent chest pain in the past, but denies any chest pain, dizziness or shortness of breath during today's visit.   States that since being evaluated in UC, dizziness has overall improved. States that he suspects symptoms are related to recent sleep deprivation. Has a 15 month old son at home. 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   Since being evaluated in UC has changed diet, decreased intake of red meat and salt . Mostly drinks juice and water. Denies adhering to any exercise regimen. Has also decreased amount of cigarettes he smokes, from 1 PPD to 6-7 cigarettes per day. Denies any other symptoms today.   Denies any fever. Denies any fatigue, chest pain, shortness of breath, HA or dizziness. Denies any blurred vision, numbness or tingling.  No past medical history on file.   Past Surgical History:  Procedure Laterality Date   I & D EXTREMITY Left 01/13/2019   Procedure: Revision of Amputation, IRRIGATION and Debridement Left ring finger.;  Surgeon: Ernest Mallick, MD;  Location: [facility] OR;  Service: Orthopedics;  Laterality: Left;    No family history on file.  Social History   Socioeconomic History   Marital status: Married    Spouse name: Not on file   Number of children: Not on file   Years of education: Not on file   Highest education level: Not on file  Occupational History   Not on file  Tobacco Use   Smoking status: Every Day    Packs/day: 0.50    Years: 20.00    Pack years:  10.00    Types: Cigarettes   Smokeless tobacco: Never  Substance and Sexual Activity   Alcohol use: Yes    Alcohol/week: 3.0 standard drinks    Types: 3 Cans of beer per week    Comment: daily- 3 drinks/day   Drug use: No   Sexual activity: Not on file  Other Topics Concern   Not on file  Social History Narrative   Not on file   Social Determinants of Health   Financial Resource Strain: Not on file  Food Insecurity: Not on file  Transportation Needs: Not on file  Physical Activity: Not on file  Stress: Not on file  Social Connections: Not on file  Intimate Partner Violence: Not on file    Outpatient Medications Prior to Visit  Medication Sig Dispense Refill   Phenyleph-Doxylamine-DM-APAP (ALKA-SELTZER PLS NIGHT CLD/FLU PO) Take 1 tablet by mouth at bedtime as needed (cold symptoms).     No facility-administered medications prior to visit.    No Known Allergies  Review of Systems  Constitutional:  Negative for chills, fever and malaise/fatigue.  HENT: Negative.    Eyes: Negative.   Respiratory:  Negative for cough and shortness of breath.   Cardiovascular:  Positive for chest pain. Negative for palpitations and leg swelling.  Gastrointestinal:  Negative for abdominal pain, blood in stool, constipation, diarrhea, nausea and vomiting.  Genitourinary: Negative.   Musculoskeletal: Negative.   Skin: Negative.   Neurological:  Positive for dizziness. Negative for tingling, tremors, sensory change, speech change, focal weakness, seizures, loss of consciousness, weakness and headaches.  Psychiatric/Behavioral:  Negative for depression. The patient is not nervous/anxious.   All other systems reviewed and are negative.     Objective:    Physical Exam Vitals reviewed.  Constitutional:      General: He is not in acute distress.    Appearance: Normal appearance.  HENT:     Head: Normocephalic.     Right Ear: Tympanic membrane, ear canal and external ear normal.     Left  Ear: Tympanic membrane, ear canal and external ear normal.     Nose: Nose normal.     Mouth/Throat:     Mouth: Mucous membranes are moist.     Pharynx: Oropharynx is clear.  Eyes:     Extraocular Movements: Extraocular movements intact.     Conjunctiva/sclera: Conjunctivae normal.     Pupils: Pupils are equal, round, and reactive to light.  Cardiovascular:     Rate and Rhythm: Normal rate and regular rhythm.     Pulses: Normal pulses.     Heart sounds: Normal heart sounds.     Comments: No obvious peripheral edema Pulmonary:     Effort: Pulmonary effort is normal.     Breath sounds: Normal breath sounds.  Abdominal:     General: Abdomen is flat. Bowel sounds are normal.     Palpations: Abdomen is soft.  Musculoskeletal:        General: Normal range of motion.     Cervical back: Normal range of motion and neck supple.  Skin:    General: Skin is warm and dry.     Capillary Refill: Capillary refill takes less than 2 seconds.  Neurological:     General: No focal deficit present.     Mental Status: He is alert and oriented to person, place, and time.  Psychiatric:        Mood and Affect: Mood normal.        Behavior: Behavior normal.        Thought Content: Thought content normal.        Judgment: Judgment normal.    BP (!) 143/95   Pulse 75   Temp 98.6 F (37 C)   SpO2 100%  Wt Readings from Last 3 Encounters:  01/13/19 200 lb (90.7 kg)  04/11/16 180 lb (81.6 kg)    Immunization History  Administered Date(s) Administered   PFIZER(Purple Top)SARS-COV-2 Vaccination 02/02/2020   Tdap 01/13/2019    Diabetic Foot Exam - Simple   No data filed     No results found for: TSH Lab Results  Component Value Date   WBC 9.6 07/09/2021   HGB 15.8 07/09/2021   HCT 47.6 07/09/2021   MCV 85.9 07/09/2021   PLT 223 07/09/2021   Lab Results  Component Value Date   NA 136 07/09/2021   K 4.5 07/09/2021   CO2 26 07/09/2021   GLUCOSE 85 07/09/2021   BUN 14 07/09/2021    CREATININE 1.19 07/09/2021   BILITOT 0.8 07/09/2021   ALKPHOS 75 07/09/2021   AST 21 07/09/2021   ALT 28 07/09/2021   PROT 7.2 07/09/2021   ALBUMIN 4.0 07/09/2021   CALCIUM 9.0 07/09/2021   ANIONGAP 8 07/09/2021   No results found for: CHOL No results found for: HDL No results found for: LDLCALC No results found for: TRIG  No results found for: CHOLHDL Lab Results  Component Value Date   HGBA1C 5.8 (H) 07/09/2021       Assessment & Plan:   Problem List Items Addressed This Visit   None Visit Diagnoses     Healthcare maintenance    -  Primary   Relevant Orders   CBC with Differential/Platelet   Comprehensive metabolic panel   Lipid panel   POCT URINALYSIS DIP (CLINITEK) Discussed at length diet and exercise options   Primary hypertension       Relevant Medications   amLODipine (NORVASC) 10 MG tablet, new prescription for B/P management. Discussed monitoring B/P at home.    Other Relevant Orders   CBC with Differential/Platelet   Comprehensive metabolic panel   Lipid panel   POCT URINALYSIS DIP (CLINITEK) Encouraged continued diet and exercise efforts  Encouraged continued compliance with medication     Tobacco abuse       Relevant Medications   nicotine (NICODERM CQ - DOSED IN MG/24 HOURS) 14 mg/24hr patch Established goals for smoking cessation   Follow up in 1 mth for reevaluation of B/P, sooner as needed    I am having Jason Li start on amLODipine and nicotine. I am also having him maintain his Phenyleph-Doxylamine-DM-APAP (ALKA-SELTZER PLS NIGHT CLD/FLU PO).  Meds ordered this encounter  Medications   amLODipine (NORVASC) 10 MG tablet    Sig: Take 1 tablet (10 mg total) by mouth daily.    Dispense:  30 tablet    Refill:  0   nicotine (NICODERM CQ - DOSED IN MG/24 HOURS) 14 mg/24hr patch    Sig: Place 1 patch (14 mg total) onto the skin daily.    Dispense:  28 patch    Refill:  0     Kathrynn Speed, NP

## 2021-07-19 LAB — CBC WITH DIFFERENTIAL/PLATELET
Basophils Absolute: 0.1 10*3/uL (ref 0.0–0.2)
Basos: 1 %
EOS (ABSOLUTE): 0.2 10*3/uL (ref 0.0–0.4)
Eos: 2 %
Hematocrit: 46.5 % (ref 37.5–51.0)
Hemoglobin: 15.8 g/dL (ref 13.0–17.7)
Immature Grans (Abs): 0 10*3/uL (ref 0.0–0.1)
Immature Granulocytes: 0 %
Lymphocytes Absolute: 2.3 10*3/uL (ref 0.7–3.1)
Lymphs: 24 %
MCH: 28 pg (ref 26.6–33.0)
MCHC: 34 g/dL (ref 31.5–35.7)
MCV: 82 fL (ref 79–97)
Monocytes Absolute: 0.6 10*3/uL (ref 0.1–0.9)
Monocytes: 7 %
Neutrophils Absolute: 6.3 10*3/uL (ref 1.4–7.0)
Neutrophils: 66 %
Platelets: 230 10*3/uL (ref 150–450)
RBC: 5.65 x10E6/uL (ref 4.14–5.80)
RDW: 13.4 % (ref 11.6–15.4)
WBC: 9.5 10*3/uL (ref 3.4–10.8)

## 2021-07-19 LAB — COMPREHENSIVE METABOLIC PANEL
ALT: 20 IU/L (ref 0–44)
AST: 20 IU/L (ref 0–40)
Albumin/Globulin Ratio: 1.7 (ref 1.2–2.2)
Albumin: 4.6 g/dL (ref 4.0–5.0)
Alkaline Phosphatase: 77 IU/L (ref 44–121)
BUN/Creatinine Ratio: 12 (ref 9–20)
BUN: 12 mg/dL (ref 6–20)
Bilirubin Total: 0.4 mg/dL (ref 0.0–1.2)
CO2: 22 mmol/L (ref 20–29)
Calcium: 9.3 mg/dL (ref 8.7–10.2)
Chloride: 103 mmol/L (ref 96–106)
Creatinine, Ser: 0.99 mg/dL (ref 0.76–1.27)
Globulin, Total: 2.7 g/dL (ref 1.5–4.5)
Glucose: 75 mg/dL (ref 70–99)
Potassium: 4.5 mmol/L (ref 3.5–5.2)
Sodium: 141 mmol/L (ref 134–144)
Total Protein: 7.3 g/dL (ref 6.0–8.5)
eGFR: 99 mL/min/{1.73_m2} (ref >59)

## 2021-07-19 LAB — LIPID PANEL
Chol/HDL Ratio: 5.8 ratio — ABNORMAL HIGH (ref 0.0–5.0)
Cholesterol, Total: 203 mg/dL — ABNORMAL HIGH (ref 100–199)
HDL: 35 mg/dL — ABNORMAL LOW (ref >39)
LDL Chol Calc (NIH): 151 mg/dL — ABNORMAL HIGH (ref 0–99)
Triglycerides: 92 mg/dL (ref 0–149)
VLDL Cholesterol Cal: 17 mg/dL (ref 5–40)

## 2021-07-21 ENCOUNTER — Other Ambulatory Visit: Payer: Self-pay | Admitting: Nurse Practitioner

## 2021-07-21 DIAGNOSIS — E7849 Other hyperlipidemia: Secondary | ICD-10-CM

## 2021-07-21 MED ORDER — ATORVASTATIN CALCIUM 20 MG PO TABS
20.0000 mg | ORAL_TABLET | Freq: Every day | ORAL | 11 refills | Status: DC
  Filled 2021-09-19 – 2021-10-20 (×2): qty 30, 30d supply, fill #0
  Filled 2021-11-19: qty 30, 30d supply, fill #1
  Filled 2021-12-19: qty 30, 30d supply, fill #2
  Filled 2022-01-19 – 2022-01-21 (×2): qty 30, 30d supply, fill #3

## 2021-08-15 ENCOUNTER — Other Ambulatory Visit: Payer: Self-pay

## 2021-08-15 ENCOUNTER — Ambulatory Visit: Payer: Self-pay | Admitting: Nurse Practitioner

## 2021-08-15 ENCOUNTER — Other Ambulatory Visit: Payer: Self-pay | Admitting: Nurse Practitioner

## 2021-08-15 VITALS — BP 130/83 | HR 83

## 2021-08-15 DIAGNOSIS — I10 Essential (primary) hypertension: Secondary | ICD-10-CM

## 2021-08-15 MED ORDER — AMLODIPINE BESYLATE 10 MG PO TABS
10.0000 mg | ORAL_TABLET | Freq: Every day | ORAL | 5 refills | Status: DC
  Filled 2021-08-20: qty 30, 30d supply, fill #0
  Filled 2021-09-19: qty 30, 30d supply, fill #1
  Filled 2021-10-20: qty 30, 30d supply, fill #0
  Filled 2021-11-19: qty 30, 30d supply, fill #1
  Filled 2021-12-19: qty 30, 30d supply, fill #2
  Filled 2022-01-19 – 2022-01-21 (×2): qty 30, 30d supply, fill #3

## 2021-08-15 NOTE — Progress Notes (Signed)
Patient in for blood pressure check.  

## 2021-08-20 ENCOUNTER — Other Ambulatory Visit: Payer: Self-pay

## 2021-08-20 ENCOUNTER — Telehealth: Payer: Self-pay

## 2021-08-20 NOTE — Telephone Encounter (Signed)
Rx transferred.

## 2021-08-20 NOTE — Telephone Encounter (Signed)
Norvasc Lipitor  Can it be sent to [facility]   He can afford [facility]

## 2021-08-25 ENCOUNTER — Ambulatory Visit: Payer: Self-pay

## 2021-09-19 ENCOUNTER — Other Ambulatory Visit: Payer: Self-pay

## 2021-09-22 ENCOUNTER — Other Ambulatory Visit: Payer: Self-pay

## 2021-10-20 ENCOUNTER — Other Ambulatory Visit: Payer: Self-pay

## 2021-11-19 ENCOUNTER — Other Ambulatory Visit: Payer: Self-pay

## 2021-11-20 ENCOUNTER — Other Ambulatory Visit: Payer: Self-pay

## 2021-12-19 ENCOUNTER — Other Ambulatory Visit: Payer: Self-pay

## 2022-01-19 ENCOUNTER — Other Ambulatory Visit: Payer: Self-pay

## 2022-01-21 ENCOUNTER — Other Ambulatory Visit: Payer: Self-pay

## 2022-02-13 ENCOUNTER — Ambulatory Visit: Payer: Self-pay | Admitting: Nurse Practitioner

## 2022-02-17 ENCOUNTER — Encounter: Payer: Self-pay | Admitting: Nurse Practitioner

## 2022-02-17 ENCOUNTER — Other Ambulatory Visit: Payer: Self-pay

## 2022-02-17 ENCOUNTER — Ambulatory Visit (INDEPENDENT_AMBULATORY_CARE_PROVIDER_SITE_OTHER): Payer: BC Managed Care – PPO | Admitting: Nurse Practitioner

## 2022-02-17 VITALS — BP 133/94 | HR 72 | Temp 98.5°F | Ht 66.0 in | Wt 184.1 lb

## 2022-02-17 DIAGNOSIS — E7849 Other hyperlipidemia: Secondary | ICD-10-CM

## 2022-02-17 DIAGNOSIS — I10 Essential (primary) hypertension: Secondary | ICD-10-CM

## 2022-02-17 DIAGNOSIS — R9431 Abnormal electrocardiogram [ECG] [EKG]: Secondary | ICD-10-CM | POA: Diagnosis not present

## 2022-02-17 LAB — POCT GLYCOSYLATED HEMOGLOBIN (HGB A1C)
HbA1c POC (<> result, manual entry): 5.6 % (ref 4.0–5.6)
HbA1c, POC (controlled diabetic range): 5.6 % (ref 0.0–7.0)
HbA1c, POC (prediabetic range): 5.6 % — AB (ref 5.7–6.4)
Hemoglobin A1C: 5.6 % (ref 4.0–5.6)

## 2022-02-17 MED ORDER — AMLODIPINE BESYLATE 10 MG PO TABS
10.0000 mg | ORAL_TABLET | Freq: Every day | ORAL | 5 refills | Status: DC
  Filled 2022-02-17: qty 30, 30d supply, fill #0
  Filled 2022-03-17: qty 30, 30d supply, fill #1
  Filled 2022-04-28 – 2022-09-16 (×2): qty 30, 30d supply, fill #2
  Filled 2022-10-17: qty 30, 30d supply, fill #3
  Filled 2022-11-19: qty 30, 30d supply, fill #4
  Filled 2022-12-25 – 2023-01-05 (×3): qty 30, 30d supply, fill #5

## 2022-02-17 MED ORDER — ATORVASTATIN CALCIUM 20 MG PO TABS
20.0000 mg | ORAL_TABLET | Freq: Every day | ORAL | 11 refills | Status: DC
  Filled 2022-02-17: qty 30, 30d supply, fill #0
  Filled 2022-03-17: qty 30, 30d supply, fill #1
  Filled 2022-04-28: qty 30, 30d supply, fill #2
  Filled 2022-06-05 – 2022-06-09 (×2): qty 30, 30d supply, fill #3
  Filled 2022-07-18: qty 30, 30d supply, fill #4
  Filled 2022-08-20 – 2022-08-24 (×2): qty 30, 30d supply, fill #5
  Filled 2022-09-16: qty 30, 30d supply, fill #6
  Filled 2022-10-17 – 2022-10-19 (×2): qty 30, 30d supply, fill #7
  Filled 2022-11-19 (×2): qty 30, 30d supply, fill #8
  Filled 2022-12-25: qty 90, 90d supply, fill #9
  Filled 2022-12-25 – 2023-01-05 (×4): qty 30, 30d supply, fill #9

## 2022-02-17 NOTE — Progress Notes (Signed)
[place], [place]  [id number] Phone:  [phone]   Fax:  [phone] Subjective:   Patient ID: Jason Li, male    DOB: June 14, 1981, 41 y.o.   MRN: [id number]  Chief Complaint  Patient presents with   Follow-up    Pt is here for 6 months BP follow up.   HPI Jason Li 41 y.o. male  has no past medical history on file. To the [facility] for reevaluation of HTN and prediabetes.   Patient states that he is monitoring meals and exercising daily. Endorses continued tobacco usage, but reduced number of cigarettes per day. Currently smokes 7-8 cigarettes per day. Patient states that he checks B/P at home regularly and it typically is lower than today's value. Patient states that he would like to be reevaluated for cardiac enlargement. Was informed in urgent care last year that he had an enlarged heart and a mild heart attack. Denies any other concerns today.   Denies any fatigue, chest pain, shortness of breath, HA or dizziness. Denies any blurred vision, numbness or tingling.   History reviewed. No pertinent past medical history.  Past Surgical History:  Procedure Laterality Date   I & D EXTREMITY Left 01/13/2019   Procedure: Revision of Amputation, IRRIGATION and Debridement Left ring finger.;  Surgeon: Verner Mould, MD;  Location: [facility];  Service: Orthopedics;  Laterality: Left;    History reviewed. No pertinent family history.  Social History   Socioeconomic History   Marital status: Married    Spouse name: Not on file   Number of children: Not on file   Years of education: Not on file   Highest education level: Not on file  Occupational History   Not on file  Tobacco Use   Smoking status: Every Day    Packs/day: 0.50    Years: 20.00    Pack years: 10.00    Types: Cigarettes   Smokeless tobacco: Never  Substance and Sexual Activity   Alcohol use: Yes    Alcohol/week: 3.0 standard drinks    Types: 3 Cans of  beer per week    Comment: daily- 3 drinks/day   Drug use: No   Sexual activity: Not on file  Other Topics Concern   Not on file  Social History Narrative   Not on file   Social Determinants of Health   Financial Resource Strain: Not on file  Food Insecurity: Not on file  Transportation Needs: Not on file  Physical Activity: Not on file  Stress: Not on file  Social Connections: Not on file  Intimate Partner Violence: Not on file    Outpatient Medications Prior to Visit  Medication Sig Dispense Refill   amLODipine (NORVASC) 10 MG tablet Take 1 tablet (10 mg total) by mouth daily. 30 tablet 5   atorvastatin (LIPITOR) 20 MG tablet Take 1 tablet (20 mg total) by mouth daily. 30 tablet 11   nicotine (NICODERM CQ - DOSED IN MG/24 HOURS) 14 mg/24hr patch Place 1 patch (14 mg total) onto the skin daily. 28 patch 0   No facility-administered medications prior to visit.    No Known Allergies  Review of Systems  Constitutional: Negative.  Negative for chills, fever and malaise/fatigue.  Respiratory:  Negative for cough and shortness of breath.   Cardiovascular:  Negative for chest pain, palpitations and leg swelling.  Gastrointestinal:  Negative for abdominal pain, blood in stool, constipation, diarrhea, nausea and vomiting.  Musculoskeletal: Negative.   Skin: Negative.   Neurological: Negative.   Psychiatric/Behavioral:  Negative for depression. The patient is not nervous/anxious.   All other systems reviewed and are negative.     Objective:    Physical Exam Vitals reviewed.  Constitutional:      General: He is not in acute distress.    Appearance: Normal appearance. He is normal weight.  HENT:     Head: Normocephalic.  Neck:     Vascular: No carotid bruit.  Cardiovascular:     Rate and Rhythm: Normal rate and regular rhythm.     Pulses: Normal pulses.     Heart sounds: Normal heart sounds.     Comments: No obvious peripheral edema Pulmonary:     Effort: Pulmonary  effort is normal.     Breath sounds: Normal breath sounds.  Musculoskeletal:        General: No swelling, tenderness, deformity or signs of injury. Normal range of motion.     Cervical back: Normal range of motion and neck supple. No rigidity or tenderness.     Right lower leg: No edema.     Left lower leg: No edema.  Lymphadenopathy:     Cervical: No cervical adenopathy.  Skin:    General: Skin is warm and dry.     Capillary Refill: Capillary refill takes less than 2 seconds.  Neurological:     General: No focal deficit present.     Mental Status: He is alert and oriented to person, place, and time.  Psychiatric:        Mood and Affect: Mood normal.        Behavior: Behavior normal.        Thought Content: Thought content normal.        Judgment: Judgment normal.    BP (!) 133/94 (BP Location: Right Arm, Patient Position: Sitting, Cuff Size: Large)   Pulse 72   Temp 98.5 F (36.9 C)   Ht '5\' 6"'  (1.676 m)   Wt 184 lb 2 oz (83.5 kg)   SpO2 100%   BMI 29.72 kg/m  Wt Readings from Last 3 Encounters:  02/17/22 184 lb 2 oz (83.5 kg)  07/18/21 200 lb (90.7 kg)  01/13/19 200 lb (90.7 kg)    Immunization History  Administered Date(s) Administered   PFIZER(Purple Top)SARS-COV-2 Vaccination 02/02/2020   Tdap 01/13/2019    Diabetic Foot Exam - Simple   No data filed     No results found for: TSH Lab Results  Component Value Date   WBC 9.5 07/18/2021   HGB 15.8 07/18/2021   HCT 46.5 07/18/2021   MCV 82 07/18/2021   PLT 230 07/18/2021   Lab Results  Component Value Date   NA 141 07/18/2021   K 4.5 07/18/2021   CO2 22 07/18/2021   GLUCOSE 75 07/18/2021   BUN 12 07/18/2021   CREATININE 0.99 07/18/2021   BILITOT 0.4 07/18/2021   ALKPHOS 77 07/18/2021   AST 20 07/18/2021   ALT 20 07/18/2021   PROT 7.3 07/18/2021   ALBUMIN 4.6 07/18/2021   CALCIUM 9.3 07/18/2021   ANIONGAP 8 07/09/2021   EGFR 99 07/18/2021   Lab Results  Component Value Date   CHOL 126  02/17/2022   CHOL 203 (H) 07/18/2021   Lab Results  Component Value Date   HDL 35 (L) 02/17/2022   HDL 35 (L) 07/18/2021   Lab Results  Component Value Date   LDLCALC 75 02/17/2022   LDLCALC 151 (H)  07/18/2021   Lab Results  Component Value Date   TRIG 78 02/17/2022   TRIG 92 07/18/2021   Lab Results  Component Value Date   CHOLHDL 3.6 02/17/2022   CHOLHDL 5.8 (H) 07/18/2021   Lab Results  Component Value Date   HGBA1C 5.6 02/17/2022   HGBA1C 5.6 02/17/2022   HGBA1C 5.6 (A) 02/17/2022   HGBA1C 5.6 02/17/2022       Assessment & Plan:   Problem List Items Addressed This Visit   None Visit Diagnoses     Primary hypertension    -  Primary   Relevant Medications   amLODipine (NORVASC) 10 MG tablet   atorvastatin (LIPITOR) 20 MG tablet Medications refilled   Other Relevant Orders   POCT glycosylated hemoglobin (Hb A1C) (Completed): 5.6, pre diabetic   EKG 12-Lead, abnormal, but reassuring of no acute ACS Encouraged continued diet and exercise efforts  Encouraged continued compliance with medication     Other hyperlipidemia       Relevant Medications   amLODipine (NORVASC) 10 MG tablet   atorvastatin (LIPITOR) 20 MG tablet   Other Relevant Orders   Lipid panel (Completed) Encouraged continued diet and exercise efforts  Encouraged continued compliance with medication     Abnormal EKG       Relevant Orders   ECHOCARDIOGRAM COMPLETE, ordered for further evaluation of possible cardiac enlargement  Referral to cardiology pending results, patient denies any cardiac symptoms since completion of UC visit several months ago   Follow up in 6 mths for reevaluation of HTN and hyperlipidemia, sooner as needed    I am having Brad L. Triska maintain his nicotine, amLODipine, and atorvastatin.  Meds ordered this encounter  Medications   amLODipine (NORVASC) 10 MG tablet    Sig: Take 1 tablet (10 mg total) by mouth daily.    Dispense:  30 tablet    Refill:  5    atorvastatin (LIPITOR) 20 MG tablet    Sig: Take 1 tablet (20 mg total) by mouth daily.    Dispense:  30 tablet    Refill:  11     Teena Dunk, NP

## 2022-02-17 NOTE — Patient Instructions (Signed)
You were seen today in the [facility] for reevaluation of HTN. Labs were collected, results will be available via MyChart or, if abnormal, you will be contacted by clinic staff. You were prescribed medications, please take as directed. Please follow up in 6 mths for reevaluation of HTN and hyperlipidemia and smoking cessation efforts. ?

## 2022-02-18 ENCOUNTER — Other Ambulatory Visit: Payer: Self-pay

## 2022-02-18 ENCOUNTER — Telehealth: Payer: Self-pay

## 2022-02-18 LAB — LIPID PANEL
Chol/HDL Ratio: 3.6 ratio (ref 0.0–5.0)
Cholesterol, Total: 126 mg/dL (ref 100–199)
HDL: 35 mg/dL — ABNORMAL LOW (ref >39)
LDL Chol Calc (NIH): 75 mg/dL (ref 0–99)
Triglycerides: 78 mg/dL (ref 0–149)
VLDL Cholesterol Cal: 16 mg/dL (ref 5–40)

## 2022-02-18 NOTE — Telephone Encounter (Signed)
Prior Authorization started for echocardiogram waiting for approval from insurance  ?

## 2022-02-27 ENCOUNTER — Other Ambulatory Visit: Payer: Self-pay | Admitting: Nurse Practitioner

## 2022-02-27 ENCOUNTER — Telehealth: Payer: Self-pay

## 2022-02-27 DIAGNOSIS — I10 Essential (primary) hypertension: Secondary | ICD-10-CM

## 2022-02-27 DIAGNOSIS — R9431 Abnormal electrocardiogram [ECG] [EKG]: Secondary | ICD-10-CM

## 2022-02-27 NOTE — Telephone Encounter (Signed)
I spoke to patient insurance [employer] at [id number] for prior authorization patient insurance is not paying for procedure. Provider is referring patient to cardiology

## 2022-03-11 NOTE — Progress Notes (Deleted)
Cardiology Office Note:    Date:  03/11/2022   ID:  Jason Li, DOB 1980/12/27, MRN [id number]  PCP:  Kathrynn Speed, NP   [facility] Providers Cardiologist:  None   Referring MD: Kathrynn Speed, NP     History of Present Illness:    Jason Li is a 41 y.o. male with a hx of HTN, tobacco abuse, and HLD who was referred by Orion Crook, NP for further evaluation of an abnormal ECG.  Was seen by Ms. Passmore on 02/17/22 where ECG demonstrated NSR but with q waves laterally, however, there was concern for possible limb lead reversal as aVR very positive. He also has hypertension and HLD requiring treatment. Given his risk factors and ECG, patient was referred to Cardiology for further management.  Today, ***    Past Medical History:  Diagnosis Date   Amputee    Chest pain    Dizziness    HLD (hyperlipidemia)    HTN (hypertension)     Past Surgical History:  Procedure Laterality Date   I & D EXTREMITY Left 01/13/2019   Procedure: Revision of Amputation, IRRIGATION and Debridement Left ring finger.;  Surgeon: Ernest Mallick, MD;  Location: [facility] OR;  Service: Orthopedics;  Laterality: Left;    Current Medications: No outpatient medications have been marked as taking for the 03/16/22 encounter (Appointment) with Meriam Sprague, MD.     Allergies:   Patient has no known allergies.   Social History   Socioeconomic History   Marital status: Married    Spouse name: Not on file   Number of children: Not on file   Years of education: Not on file   Highest education level: Not on file  Occupational History   Not on file  Tobacco Use   Smoking status: Every Day    Packs/day: 0.50    Years: 20.00    Pack years: 10.00    Types: Cigarettes   Smokeless tobacco: Never  Substance and Sexual Activity   Alcohol use: Yes    Alcohol/week: 3.0 standard drinks    Types: 3 Cans of beer per week    Comment: daily- 3 drinks/day   Drug use: No    Sexual activity: Not on file  Other Topics Concern   Not on file  Social History Narrative   Not on file   Social Determinants of Health   Financial Resource Strain: Not on file  Food Insecurity: Not on file  Transportation Needs: Not on file  Physical Activity: Not on file  Stress: Not on file  Social Connections: Not on file     Family History: The patient's ***family history is not on file.  ROS:   Please see the history of present illness.    *** All other systems reviewed and are negative.  EKGs/Labs/Other Studies Reviewed:    The following studies were reviewed today: ***  EKG:  EKG is *** ordered today.  The ekg ordered today demonstrates ***  Recent Labs: 07/18/2021: ALT 20; BUN 12; Creatinine, Ser 0.99; Hemoglobin 15.8; Platelets 230; Potassium 4.5; Sodium 141  Recent Lipid Panel    Component Value Date/Time   CHOL 126 02/17/2022 0919   TRIG 78 02/17/2022 0919   HDL 35 (L) 02/17/2022 0919   CHOLHDL 3.6 02/17/2022 0919   LDLCALC 75 02/17/2022 0919     Risk Assessment/Calculations:   {Does this patient have ATRIAL FIBRILLATION?:[phone]}       Physical Exam:  VS:  There were no vitals taken for this visit.    Wt Readings from Last 3 Encounters:  02/17/22 184 lb 2 oz (83.5 kg)  07/18/21 200 lb (90.7 kg)  01/13/19 200 lb (90.7 kg)     GEN: *** Well nourished, well developed in no acute distress HEENT: Normal NECK: No JVD; No carotid bruits LYMPHATICS: No lymphadenopathy CARDIAC: ***RRR, no murmurs, rubs, gallops RESPIRATORY:  Clear to auscultation without rales, wheezing or rhonchi  ABDOMEN: Soft, non-tender, non-distended MUSCULOSKELETAL:  No edema; No deformity  SKIN: Warm and dry NEUROLOGIC:  Alert and oriented x 3 PSYCHIATRIC:  Normal affect   ASSESSMENT:    No diagnosis found. PLAN:    In order of problems listed above:  #Abnormal ECG: Repeat ECG today shows ****. Currently patient feels well with no anginal symptoms.  TTE  has already been ordered by primary provider. Will follow-up results.  -TTE pending  #HTN: -Continue amlodipine 10mg  daily  #HLD: LDL 75 on liptor 20mg  daily -Continue lipitor 20mg  daily  #Tobacco use: -Encourage cessation      {Are you ordering a CV Procedure (e.g. stress test, cath, DCCV, TEE, etc)?   Press F2        :    Medication Adjustments/Labs and Tests Ordered: Current medicines are reviewed at length with the patient today.  Concerns regarding medicines are outlined above.  No orders of the defined types were placed in this encounter.  No orders of the defined types were placed in this encounter.   There are no Patient Instructions on file for this visit.   Signed, , MD  03/11/2022 4:23 PM    [facility]

## 2022-03-16 ENCOUNTER — Ambulatory Visit: Payer: BC Managed Care – PPO | Admitting: Cardiology

## 2022-03-17 ENCOUNTER — Other Ambulatory Visit: Payer: Self-pay

## 2022-03-18 ENCOUNTER — Telehealth: Payer: Self-pay | Admitting: Nurse Practitioner

## 2022-03-19 ENCOUNTER — Other Ambulatory Visit: Payer: Self-pay

## 2022-03-26 ENCOUNTER — Telehealth: Payer: Self-pay | Admitting: Nurse Practitioner

## 2022-03-26 ENCOUNTER — Ambulatory Visit: Payer: Self-pay | Admitting: Nurse Practitioner

## 2022-03-26 NOTE — Telephone Encounter (Signed)
Patient would like to reschedule EKG from 03/16/2022 Can you please call patient with new appt.

## 2022-04-10 NOTE — Progress Notes (Unsigned)
Cardiology Office Note:    Date:  04/10/2022   ID:  Jason Li, DOB 1981-01-06, MRN [id number]  PCP:  Kathrynn Speed, NP   [facility] Providers Cardiologist:  None   Referring MD: Kathrynn Speed, NP   History of Present Illness:    Jason Li is a 41 y.o. male with a hx of HTN, HLD and   Past Medical History:  Diagnosis Date   Amputee    Chest pain    Dizziness    HLD (hyperlipidemia)    HTN (hypertension)     Past Surgical History:  Procedure Laterality Date   I & D EXTREMITY Left 01/13/2019   Procedure: Revision of Amputation, IRRIGATION and Debridement Left ring finger.;  Surgeon: Ernest Mallick, MD;  Location: [facility];  Service: Orthopedics;  Laterality: Left;    Current Medications: No outpatient medications have been marked as taking for the 04/14/22 encounter (Appointment) with Meriam Sprague, MD.     Allergies:   Patient has no known allergies.   Social History   Socioeconomic History   Marital status: Married    Spouse name: Not on file   Number of children: Not on file   Years of education: Not on file   Highest education level: Not on file  Occupational History   Not on file  Tobacco Use   Smoking status: Every Day    Packs/day: 0.50    Years: 20.00    Total pack years: 10.00    Types: Cigarettes   Smokeless tobacco: Never  Substance and Sexual Activity   Alcohol use: Yes    Alcohol/week: 3.0 standard drinks of alcohol    Types: 3 Cans of beer per week    Comment: daily- 3 drinks/day   Drug use: No   Sexual activity: Not on file  Other Topics Concern   Not on file  Social History Narrative   Not on file   Social Determinants of Health   Financial Resource Strain: Not on file  Food Insecurity: Not on file  Transportation Needs: Not on file  Physical Activity: Not on file  Stress: Not on file  Social Connections: Not on file     Family History: The patient's ***family history is not on  file.  ROS:   Please see the history of present illness.    *** All other systems reviewed and are negative.  EKGs/Labs/Other Studies Reviewed:    The following studies were reviewed today: ***  EKG:  EKG is *** ordered today.  The ekg ordered today demonstrates ***  Recent Labs: 07/18/2021: ALT 20; BUN 12; Creatinine, Ser 0.99; Hemoglobin 15.8; Platelets 230; Potassium 4.5; Sodium 141  Recent Lipid Panel    Component Value Date/Time   CHOL 126 02/17/2022 0919   TRIG 78 02/17/2022 0919   HDL 35 (L) 02/17/2022 0919   CHOLHDL 3.6 02/17/2022 0919   LDLCALC 75 02/17/2022 0919     Risk Assessment/Calculations:   {Does this patient have ATRIAL FIBRILLATION?:[id number]}       Physical Exam:    VS:  There were no vitals taken for this visit.    Wt Readings from Last 3 Encounters:  02/17/22 184 lb 2 oz (83.5 kg)  07/18/21 200 lb (90.7 kg)  01/13/19 200 lb (90.7 kg)     GEN: *** Well nourished, well developed in no acute distress HEENT: Normal NECK: No JVD; No carotid bruits LYMPHATICS: No lymphadenopathy CARDIAC: ***RRR, no murmurs, rubs, gallops  RESPIRATORY:  Clear to auscultation without rales, wheezing or rhonchi  ABDOMEN: Soft, non-tender, non-distended MUSCULOSKELETAL:  No edema; No deformity  SKIN: Warm and dry NEUROLOGIC:  Alert and oriented x 3 PSYCHIATRIC:  Normal affect   ASSESSMENT:    No diagnosis found. PLAN:    In order of problems listed above:  ***      {Are you ordering a CV Procedure (e.g. stress test, cath, DCCV, TEE, etc)?   Press F2        :[id number]}    Medication Adjustments/Labs and Tests Ordered: Current medicines are reviewed at length with the patient today.  Concerns regarding medicines are outlined above.  No orders of the defined types were placed in this encounter.  No orders of the defined types were placed in this encounter.   There are no Patient Instructions on file for this visit.   Signed, Meriam Sprague, MD   04/10/2022 7:11 AM    [facility]

## 2022-04-13 NOTE — Telephone Encounter (Signed)
error 

## 2022-04-14 ENCOUNTER — Ambulatory Visit (INDEPENDENT_AMBULATORY_CARE_PROVIDER_SITE_OTHER): Payer: Self-pay | Admitting: Cardiology

## 2022-04-14 ENCOUNTER — Encounter: Payer: Self-pay | Admitting: Cardiology

## 2022-04-14 ENCOUNTER — Other Ambulatory Visit: Payer: Self-pay

## 2022-04-14 VITALS — BP 122/90 | HR 77 | Ht 66.0 in | Wt 181.2 lb

## 2022-04-14 DIAGNOSIS — R072 Precordial pain: Secondary | ICD-10-CM

## 2022-04-14 DIAGNOSIS — R9431 Abnormal electrocardiogram [ECG] [EKG]: Secondary | ICD-10-CM

## 2022-04-14 DIAGNOSIS — I10 Essential (primary) hypertension: Secondary | ICD-10-CM

## 2022-04-14 DIAGNOSIS — E7800 Pure hypercholesterolemia, unspecified: Secondary | ICD-10-CM

## 2022-04-14 MED ORDER — METOPROLOL TARTRATE 100 MG PO TABS
100.0000 mg | ORAL_TABLET | Freq: Once | ORAL | 0 refills | Status: DC
  Filled 2022-04-14: qty 1, 1d supply, fill #0

## 2022-04-14 NOTE — Progress Notes (Signed)
Cardiology Office Note:    Date:  04/14/2022   ID:  Jason Li, DOB 04-13-1981, MRN [id number]  PCP:  Kathrynn Speed, NP   [facility] Providers Cardiologist:  None   Referring MD: Kathrynn Speed, NP   History of Present Illness:    Jason Li is a 41 y.o. male with a hx of HTN and HLD who was referred by Orion Crook for further evaluation of abnormal ECG.  Patient was seen by Orion Crook, NP on 02/17/22. Note reviewed. Patient had been told that he had an enlarged heart. ECG showed NSR with lateral infarct pattern prompting referral to Cardiology for further management.   Today, the patient states he has been feeling okay. He has been experiencing exertional chest discomfort with associated shortness of breath that has been going on for several months. Symptoms are worse with climbing stairs, inclines or carrying something heavy. No rest symptoms. He reports occasional   lightheadedness when standing for long periods of time but no exertional lightheadedness. No orthopnea, PND or LE edema.  He remains compliant with BP and cholesterol medication. He currently smokes 12 cigarettes each day, and has been smoking for 20 years. Family history notable for father who had 3V CABG in 26s.   Currently does not have insurance, but he can obtain it through work.   Past Medical History:  Diagnosis Date   Amputee    Chest pain    Dizziness    HLD (hyperlipidemia)    HTN (hypertension)     Past Surgical History:  Procedure Laterality Date   I & D EXTREMITY Left 01/13/2019   Procedure: Revision of Amputation, IRRIGATION and Debridement Left ring finger.;  Surgeon: Ernest Mallick, MD;  Location: [facility] OR;  Service: Orthopedics;  Laterality: Left;    Current Medications: Current Meds  Medication Sig   amLODipine (NORVASC) 10 MG tablet Take 1 tablet (10 mg total) by mouth once daily.   atorvastatin (LIPITOR) 20 MG tablet Take 1 tablet (20 mg total) by  mouth once daily.   metoprolol tartrate (LOPRESSOR) 100 MG tablet Take 1 tablet (100 mg total) by mouth once for 1 dose. Take 90-120 minutes prior to scan.     Allergies:   Patient has no known allergies.   Social History   Socioeconomic History   Marital status: Married    Spouse name: Not on file   Number of children: Not on file   Years of education: Not on file   Highest education level: Not on file  Occupational History   Not on file  Tobacco Use   Smoking status: Every Day    Packs/day: 0.50    Years: 20.00    Total pack years: 10.00    Types: Cigarettes   Smokeless tobacco: Never  Substance and Sexual Activity   Alcohol use: Yes    Alcohol/week: 3.0 standard drinks of alcohol    Types: 3 Cans of beer per week    Comment: daily- 3 drinks/day   Drug use: No   Sexual activity: Not on file  Other Topics Concern   Not on file  Social History Narrative   Not on file   Social Determinants of Health   Financial Resource Strain: Not on file  Food Insecurity: Not on file  Transportation Needs: Not on file  Physical Activity: Not on file  Stress: Not on file  Social Connections: Not on file     Family History: Father with CAD  with history of coronary bypass in 60s  ROS:   Please see the history of present illness.    (+) Chest discomfort (+) exertional SOB (+) Lightheadedness/dizziness All other systems reviewed and are negative.  EKGs/Labs/Other Studies Reviewed:    The following studies were reviewed today: No CV studies  EKG:  EKG is personally reviewed.  04/14/2022 EKG: NSR, HR 77, isolated q in III  Recent Labs: 07/18/2021: ALT 20; BUN 12; Creatinine, Ser 0.99; Hemoglobin 15.8; Platelets 230; Potassium 4.5; Sodium 141  Recent Lipid Panel    Component Value Date/Time   CHOL 126 02/17/2022 0919   TRIG 78 02/17/2022 0919   HDL 35 (L) 02/17/2022 0919   CHOLHDL 3.6 02/17/2022 0919   LDLCALC 75 02/17/2022 0919     Risk Assessment/Calculations:            Physical Exam:    VS:  BP 122/90   Pulse 77   Ht 5\' 6"  (1.676 m)   Wt 181 lb 3.2 oz (82.2 kg)   SpO2 98%   BMI 29.25 kg/m     Wt Readings from Last 3 Encounters:  04/14/22 181 lb 3.2 oz (82.2 kg)  02/17/22 184 lb 2 oz (83.5 kg)  07/18/21 200 lb (90.7 kg)     GEN:  Well nourished, well developed in no acute distress HEENT: Normal NECK: No JVD; No carotid bruits CARDIAC: RRR, 1/6 systolic murmur RUSB RESPIRATORY: Clear to auscultation without rales, wheezing or rhonchi  ABDOMEN: Soft, non-tender, non-distended MUSCULOSKELETAL: No edema; No deformity  SKIN: Warm and dry NEUROLOGIC:  Alert and oriented x 3 PSYCHIATRIC:  Normal affect   ASSESSMENT:    1. Precordial pain   2. Abnormal EKG   3. Primary hypertension   4. Pure hypercholesterolemia    PLAN:    In order of problems listed above:  #Chest Pain with Exertion: #Abnormal ECG: Patient reports several month history of chest pain/tightness with exertion with associated SOB. Symptoms can also occur with stress but do not take place while at rest. ECG at PCP office with lateral infarct pattern which is not present today. Given symptoms, however, will proceed with TTE and coronary CTA. Notably, the patient does not have insurance but would like to proceed with these tests at this time (cost was discussed). If it ends up being too expensive, can change to ETT and Ca score. -Check TTE and coronary CTA -If too expensive due to lack of insurance, can change to ETT and Ca score (patient wished to proceed with CTA and TTE at this time)  #HTN: Well controlled.  -Continue amlodipine 10mg  daily  #HLD: -Continue lipitor 20mg  daily -LDL 78; can adjust pending CTA findings         Follow up in 1 year or PRN  Medication Adjustments/Labs and Tests Ordered: Current medicines are reviewed at length with the patient today.  Concerns regarding medicines are outlined above.  Orders Placed This Encounter  Procedures    CT CORONARY MORPH W/CTA COR W/SCORE W/CA W/CM &/OR WO/CM   Basic metabolic panel   EKG 12-Lead   ECHOCARDIOGRAM COMPLETE   Meds ordered this encounter  Medications   metoprolol tartrate (LOPRESSOR) 100 MG tablet    Sig: Take 1 tablet (100 mg total) by mouth once for 1 dose. Take 90-120 minutes prior to scan.    Dispense:  1 tablet    Refill:  0    Patient Instructions  Medication Instructions:   Your physician recommends that you continue on  your current medications as directed. Please refer to the Current Medication list given to you today.  *If you need a refill on your cardiac medications before your next appointment, please call your pharmacy*   Testing/Procedures:  Your physician has requested that you have an echocardiogram. Echocardiography is a painless test that uses sound waves to create images of your heart. It provides your doctor with information about the size and shape of your heart and how well your heart's chambers and valves are working. This procedure takes approximately one hour. There are no restrictions for this procedure.    Your cardiac CT will be scheduled at one of the below locations:   [facility] [address] [place], [place] [address] [phone]   If scheduled at [facility], please arrive at the [employer] and Children's Entrance (Entrance C2) of [facility] 30 minutes prior to test start time. You can use the FREE valet parking offered at entrance C (encouraged to control the heart rate for the test)  Proceed to the [facility] Radiology Department (first floor) to check-in and test prep.  All radiology patients and guests should use entrance C2 at [facility], accessed from [facility], even though the hospital's physical address listed is [address].     Please follow these instructions carefully (unless otherwise directed):  Hold all erectile dysfunction medications at least  3 days (72 hrs) prior to test.  On the Night Before the Test: Be sure to Drink plenty of water. Do not consume any caffeinated/decaffeinated beverages or chocolate 12 hours prior to your test. Do not take any antihistamines 12 hours prior to your test.   On the Day of the Test: Drink plenty of water until 1 hour prior to the test. Do not eat any food 4 hours prior to the test. You may take your regular medications prior to the test.  Take metoprolol 100 mg by mouth (Lopressor) two hours prior to test.    After the Test: Drink plenty of water. After receiving IV contrast, you may experience a mild flushed feeling. This is normal. On occasion, you may experience a mild rash up to 24 hours after the test. This is not dangerous. If this occurs, you can take Benadryl 25 mg and increase your fluid intake. If you experience trouble breathing, this can be serious. If it is severe call 911 IMMEDIATELY. If it is mild, please call our office.  We will call to schedule your test 2-4 weeks out understanding that some insurance companies will need an authorization prior to the service being performed.   For non-scheduling related questions, please contact the cardiac imaging nurse navigator should you have any questions/concerns: Rockwell Alexandria, Cardiac Imaging Nurse Navigator Larey Brick, Cardiac Imaging Nurse Navigator [facility] Direct Office Dial: [phone]   For scheduling needs, including cancellations and rescheduling, please call [place], [phone].   Follow-Up: At [facility], you and your health needs are our priority.  As part of our continuing mission to provide you with exceptional heart care, we have created designated Provider Care Teams.  These Care Teams include your primary Cardiologist (physician) and Advanced Practice Providers (APPs -  Physician Assistants and Nurse Practitioners) who all work together to provide you with the care you  need, when you need it.  We recommend signing up for the patient portal called "MyChart".  Sign up information is provided on this After Visit Summary.  MyChart  is used to connect with patients for Virtual Visits (Telemedicine).  Patients are able to view lab/test results, encounter notes, upcoming appointments, etc.  Non-urgent messages can be sent to your provider as well.   To learn more about what you can do with MyChart, go to [email or web address].    Your next appointment:   1 year(s)  The format for your next appointment:   In Person  Provider:   DR. Shari Prows  Important Information About Sugar          I,Tinashe Williams,acting as a scribe for Meriam Sprague, MD.,have documented all relevant documentation on the behalf of Meriam Sprague, MD,as directed by  Meriam Sprague, MD while in the presence of Meriam Sprague, MD.   I, Meriam Sprague, MD, have reviewed all documentation for this visit. The documentation on 04/14/22 for the exam, diagnosis, procedures, and orders are all accurate and complete.

## 2022-04-14 NOTE — Patient Instructions (Signed)
Medication Instructions:   Your physician recommends that you continue on your current medications as directed. Please refer to the Current Medication list given to you today.  *If you need a refill on your cardiac medications before your next appointment, please call your pharmacy*   Testing/Procedures:  Your physician has requested that you have an echocardiogram. Echocardiography is a painless test that uses sound waves to create images of your heart. It provides your doctor with information about the size and shape of your heart and how well your heart's chambers and valves are working. This procedure takes approximately one hour. There are no restrictions for this procedure.    Your cardiac CT will be scheduled at one of the below locations:   [facility] [address] [place], [place] [address] [phone]   If scheduled at [facility], please arrive at the [facility] and Children's Entrance (Entrance C2) of [facility] 30 minutes prior to test start time. You can use the FREE valet parking offered at entrance C (encouraged to control the heart rate for the test)  Proceed to the [facility] Radiology Department (first floor) to check-in and test prep.  All radiology patients and guests should use entrance C2 at [facility], accessed from [facility], even though the hospital's physical address listed is [address]     Please follow these instructions carefully (unless otherwise directed):  Hold all erectile dysfunction medications at least 3 days (72 hrs) prior to test.  On the Night Before the Test: Be sure to Drink plenty of water. Do not consume any caffeinated/decaffeinated beverages or chocolate 12 hours prior to your test. Do not take any antihistamines 12 hours prior to your test.   On the Day of the Test: Drink plenty of water until 1 hour prior to the test. Do not eat any food 4 hours prior to the  test. You may take your regular medications prior to the test.  Take metoprolol 100 mg by mouth (Lopressor) two hours prior to test.    After the Test: Drink plenty of water. After receiving IV contrast, you may experience a mild flushed feeling. This is normal. On occasion, you may experience a mild rash up to 24 hours after the test. This is not dangerous. If this occurs, you can take Benadryl 25 mg and increase your fluid intake. If you experience trouble breathing, this can be serious. If it is severe call 911 IMMEDIATELY. If it is mild, please call our office.  We will call to schedule your test 2-4 weeks out understanding that some insurance companies will need an authorization prior to the service being performed.   For non-scheduling related questions, please contact the cardiac imaging nurse navigator should you have any questions/concerns: Rockwell Alexandria, Cardiac Imaging Nurse Navigator Larey Brick, Cardiac Imaging Nurse Navigator [facility] Direct Office Dial: [phone]   For scheduling needs, including cancellations and rescheduling, please call [place], [phone].   Follow-Up: At [facility], you and your health needs are our priority.  As part of our continuing mission to provide you with exceptional heart care, we have created designated Provider Care Teams.  These Care Teams include your primary Cardiologist (physician) and Advanced Practice Providers (APPs -  Physician Assistants and Nurse Practitioners) who all work together to provide you with the care you need, when you need it.  We recommend signing up for the patient portal called "MyChart".  Sign up  information is provided on this After Visit Summary.  MyChart is used to connect with patients for Virtual Visits (Telemedicine).  Patients are able to view lab/test results, encounter notes, upcoming appointments, etc.  Non-urgent messages can be sent to your provider as well.   To  learn more about what you can do with MyChart, go to [email or web address].    Your next appointment:   1 year(s)  The format for your next appointment:   In Person  Provider:   DR. Shari Prows  Important Information About Sugar

## 2022-04-17 ENCOUNTER — Telehealth: Payer: Self-pay | Admitting: *Deleted

## 2022-04-17 NOTE — Telephone Encounter (Signed)
-----   Message from Lorrin Jackson sent at 04/17/2022  3:11 PM EDT ----- Regarding: ct heart Scheduled 05/06/22 at 3:30

## 2022-04-28 ENCOUNTER — Other Ambulatory Visit: Payer: Self-pay

## 2022-04-29 ENCOUNTER — Other Ambulatory Visit: Payer: Self-pay

## 2022-05-05 ENCOUNTER — Telehealth (HOSPITAL_COMMUNITY): Payer: Self-pay | Admitting: Emergency Medicine

## 2022-05-05 ENCOUNTER — Ambulatory Visit (HOSPITAL_COMMUNITY): Payer: Self-pay | Attending: Cardiovascular Disease

## 2022-05-05 NOTE — Telephone Encounter (Signed)
Attempted to call patient regarding upcoming cardiac CT appointment. °Left message on voicemail with name and callback number °Ezrah Dembeck RN Navigator Cardiac Imaging °[facility] °[phone] Office °[phone] Cell ° °

## 2022-05-06 ENCOUNTER — Ambulatory Visit (HOSPITAL_COMMUNITY)
Admission: RE | Admit: 2022-05-06 | Discharge: 2022-05-06 | Disposition: A | Discharge status: Home / Self Care | Payer: Self-pay | Source: Ambulatory Visit | Attending: Cardiology | Admitting: Cardiology

## 2022-05-06 DIAGNOSIS — R072 Precordial pain: Secondary | ICD-10-CM | POA: Insufficient documentation

## 2022-05-06 DIAGNOSIS — R9431 Abnormal electrocardiogram [ECG] [EKG]: Secondary | ICD-10-CM | POA: Insufficient documentation

## 2022-05-06 MED ORDER — METOPROLOL TARTRATE 5 MG/5ML IV SOLN: 10.0000 mg | Freq: Once | INTRAVENOUS | Status: AC

## 2022-05-06 MED ORDER — NITROGLYCERIN 0.4 MG SL SUBL
SUBLINGUAL_TABLET | SUBLINGUAL | Status: AC
  Filled 2022-05-06: qty 2

## 2022-05-06 MED ORDER — NITROGLYCERIN 0.4 MG SL SUBL
0.8000 mg | SUBLINGUAL_TABLET | Freq: Once | SUBLINGUAL | Status: AC
  Administered 2022-05-06: 0.8 mg via SUBLINGUAL

## 2022-05-06 MED ORDER — METOPROLOL TARTRATE 5 MG/5ML IV SOLN
INTRAVENOUS | Status: AC
  Administered 2022-05-06: 10 mg via INTRAVENOUS
  Filled 2022-05-06: qty 10

## 2022-05-06 MED ORDER — IOHEXOL 350 MG/ML SOLN
100.0000 mL | Freq: Once | INTRAVENOUS | Status: AC | PRN
  Administered 2022-05-06: 100 mL via INTRAVENOUS

## 2022-05-08 ENCOUNTER — Other Ambulatory Visit (HOSPITAL_COMMUNITY): Payer: Self-pay

## 2022-05-11 ENCOUNTER — Other Ambulatory Visit: Payer: Self-pay

## 2022-05-11 ENCOUNTER — Encounter (HOSPITAL_COMMUNITY): Payer: Self-pay | Admitting: Cardiology

## 2022-06-06 ENCOUNTER — Other Ambulatory Visit (HOSPITAL_COMMUNITY): Payer: Self-pay

## 2022-06-09 ENCOUNTER — Other Ambulatory Visit (HOSPITAL_COMMUNITY): Payer: Self-pay

## 2022-06-09 ENCOUNTER — Other Ambulatory Visit: Payer: Self-pay

## 2022-07-10 ENCOUNTER — Other Ambulatory Visit (HOSPITAL_COMMUNITY): Payer: Self-pay

## 2022-07-20 ENCOUNTER — Other Ambulatory Visit: Payer: Self-pay

## 2022-07-22 ENCOUNTER — Other Ambulatory Visit: Payer: Self-pay

## 2022-08-20 ENCOUNTER — Other Ambulatory Visit (HOSPITAL_COMMUNITY): Payer: Self-pay

## 2022-08-20 ENCOUNTER — Ambulatory Visit: Payer: Self-pay | Admitting: Nurse Practitioner

## 2022-08-24 ENCOUNTER — Other Ambulatory Visit: Payer: Self-pay

## 2022-08-25 ENCOUNTER — Other Ambulatory Visit (HOSPITAL_COMMUNITY): Payer: Self-pay

## 2022-09-04 ENCOUNTER — Other Ambulatory Visit: Payer: Self-pay

## 2022-09-04 ENCOUNTER — Ambulatory Visit (INDEPENDENT_AMBULATORY_CARE_PROVIDER_SITE_OTHER): Payer: Self-pay | Admitting: Nurse Practitioner

## 2022-09-04 ENCOUNTER — Encounter: Payer: Self-pay | Admitting: Nurse Practitioner

## 2022-09-04 VITALS — BP 137/85 | HR 75 | Temp 98.2°F | Ht 67.0 in | Wt 185.8 lb

## 2022-09-04 DIAGNOSIS — R7303 Prediabetes: Secondary | ICD-10-CM

## 2022-09-04 DIAGNOSIS — Z716 Tobacco abuse counseling: Secondary | ICD-10-CM

## 2022-09-04 DIAGNOSIS — F17200 Nicotine dependence, unspecified, uncomplicated: Secondary | ICD-10-CM | POA: Insufficient documentation

## 2022-09-04 DIAGNOSIS — I10 Essential (primary) hypertension: Secondary | ICD-10-CM

## 2022-09-04 DIAGNOSIS — E785 Hyperlipidemia, unspecified: Secondary | ICD-10-CM

## 2022-09-04 DIAGNOSIS — K219 Gastro-esophageal reflux disease without esophagitis: Secondary | ICD-10-CM

## 2022-09-04 LAB — POCT GLYCOSYLATED HEMOGLOBIN (HGB A1C): Hemoglobin A1C: 5.4 % (ref 4.0–5.6)

## 2022-09-04 MED ORDER — PANTOPRAZOLE SODIUM 40 MG PO TBEC
40.0000 mg | DELAYED_RELEASE_TABLET | Freq: Every day | ORAL | 3 refills | Status: DC
  Filled 2022-09-04 (×2): qty 30, 30d supply, fill #0
  Filled 2022-09-16 – 2022-10-19 (×3): qty 30, 30d supply, fill #1
  Filled 2022-11-19 (×2): qty 30, 30d supply, fill #2
  Filled 2022-12-25 – 2023-01-05 (×5): qty 30, 30d supply, fill #3

## 2022-09-04 NOTE — Assessment & Plan Note (Signed)
Currently on atorvastatin 20 mg daily Lab Results  Component Value Date   CHOL 126 02/17/2022   HDL 35 (L) 02/17/2022   LDLCALC 75 02/17/2022   TRIG 78 02/17/2022   CHOLHDL 3.6 02/17/2022  Continue current medication avoid fried fatty foods will check lipid panel at next visit

## 2022-09-04 NOTE — Progress Notes (Addendum)
Established Patient Office Visit  Subjective:  Patient ID: Jason Li, male    DOB: 02/01/1981  Age: 41 y.o. MRN: [id number]  CC:  Chief Complaint  Patient presents with   Follow-up    Six month check on bp    HPI Jason Li is a 41 y.o. male with past medical history of hyperlipidemia, hypertension, tobacco abuse, prediabetes presents for follow-up for his chronic medical conditions.  Patient was in his office by Verlon Setting, NP   HTN.  Patient reports blood pressure readings of less than 130/80 at home, states that he has stopped taking amlodipine since the past 3 months due to blood pressure being normal.  He currently denies chest pain, edema, syncope  Tobacco abuse.  Started smoking cigarettes at age 70 currently smokes 8 cigarettes daily states that he was smoking more than days before sometimes has wheezing, productive cough but denies shortness of breath.   Hyperlipidemia.  Currently on atorvastatin 20 mg daily patient denies any adverse reaction to this medication.  Patient complains of acid reflux symptoms epigastric abdominal pain, states that he was taking an antacid does not remember the name of the medication.  He denies constipation, fever, chills, blood in the stool.   Due for flu vaccine need for flu vaccine discussed patient encouraged to get the vaccine at his pharmacy       Past Medical History:  Diagnosis Date   Amputee    Chest pain    Dizziness    HLD (hyperlipidemia)    HTN (hypertension)     Past Surgical History:  Procedure Laterality Date   I & D EXTREMITY Left 01/13/2019   Procedure: Revision of Amputation, IRRIGATION and Debridement Left ring finger.;  Surgeon: Verner Mould, MD;  Location: [facility];  Service: Orthopedics;  Laterality: Left;    History reviewed. No pertinent family history.  Social History   Socioeconomic History   Marital status: Married    Spouse name: Not on file   Number of children: Not on  file   Years of education: Not on file   Highest education level: Not on file  Occupational History   Not on file  Tobacco Use   Smoking status: Every Day    Packs/day: 0.50    Years: 20.00    Total pack years: 10.00    Types: Cigarettes   Smokeless tobacco: Never  Substance and Sexual Activity   Alcohol use: Yes    Alcohol/week: 3.0 standard drinks of alcohol    Types: 3 Cans of beer per week    Comment: daily- 3 drinks/day   Drug use: No   Sexual activity: Not on file  Other Topics Concern   Not on file  Social History Narrative   Not on file   Social Determinants of Health   Financial Resource Strain: Not on file  Food Insecurity: Not on file  Transportation Needs: Not on file  Physical Activity: Not on file  Stress: Not on file  Social Connections: Not on file  Intimate Partner Violence: Not on file    Outpatient Medications Prior to Visit  Medication Sig Dispense Refill   atorvastatin (LIPITOR) 20 MG tablet Take 1 tablet (20 mg total) by mouth once daily. 30 tablet 11   amLODipine (NORVASC) 10 MG tablet Take 1 tablet (10 mg total) by mouth once daily. (Patient not taking: Reported on 09/04/2022) 30 tablet 5   metoprolol tartrate (LOPRESSOR) 100 MG tablet Take 1 tablet (100  mg total) by mouth once for 1 dose. Take 90-120 minutes prior to scan. 1 tablet 0   No facility-administered medications prior to visit.    No Known Allergies  ROS Review of Systems  Constitutional:  Negative for activity change, appetite change, chills, diaphoresis and fatigue.  Respiratory:  Positive for cough and wheezing. Negative for apnea, chest tightness and shortness of breath.   Cardiovascular: Negative.  Negative for chest pain, palpitations and leg swelling.  Gastrointestinal:  Positive for abdominal pain. Negative for anal bleeding, blood in stool, constipation, diarrhea, nausea, rectal pain and vomiting.  Endocrine: Negative for polyphagia and polyuria.  Neurological:  Negative  for seizures, facial asymmetry, speech difficulty, numbness and headaches.  Psychiatric/Behavioral: Negative.  Negative for agitation, behavioral problems, confusion and decreased concentration.       Objective:    Physical Exam Constitutional:      General: He is not in acute distress.    Appearance: Normal appearance. He is not ill-appearing, toxic-appearing or diaphoretic.  Eyes:     General: No scleral icterus.       Right eye: No discharge.        Left eye: No discharge.  Cardiovascular:     Rate and Rhythm: Normal rate and regular rhythm.     Pulses: Normal pulses.     Heart sounds: Normal heart sounds. No murmur heard.    No friction rub. No gallop.  Pulmonary:     Effort: Pulmonary effort is normal. No respiratory distress.     Breath sounds: Normal breath sounds. No stridor. No wheezing, rhonchi or rales.  Chest:     Chest wall: No tenderness.  Abdominal:     General: There is no distension.     Palpations: Abdomen is soft. There is no mass.     Tenderness: There is no abdominal tenderness. There is no guarding.  Musculoskeletal:        General: No swelling, tenderness, deformity or signs of injury. Normal range of motion.     Right lower leg: No edema.     Left lower leg: No edema.  Skin:    General: Skin is warm and dry.     Capillary Refill: Capillary refill takes less than 2 seconds.  Neurological:     Mental Status: He is alert.  Psychiatric:        Mood and Affect: Mood normal.        Behavior: Behavior normal.        Thought Content: Thought content normal.        Judgment: Judgment normal.     BP 137/85   Pulse 75   Temp 98.2 F (36.8 C)   Ht _0  (1.702 m)   Wt 185 lb 12.8 oz (84.3 kg)   SpO2 100%   BMI 29.10 kg/m  Wt Readings from Last 3 Encounters:  09/04/22 185 lb 12.8 oz (84.3 kg)  04/14/22 181 lb 3.2 oz (82.2 kg)  02/17/22 184 lb 2 oz (83.5 kg)    No results found for: "TSH" Lab Results  Component Value Date   WBC 9.5 07/18/2021    HGB 15.8 07/18/2021   HCT 46.5 07/18/2021   MCV 82 07/18/2021   PLT 230 07/18/2021   Lab Results  Component Value Date   NA 141 07/18/2021   K 4.5 07/18/2021   CO2 22 07/18/2021   GLUCOSE 75 07/18/2021   BUN 12 07/18/2021   CREATININE 0.99 07/18/2021   BILITOT 0.4 07/18/2021  ALKPHOS 77 07/18/2021   AST 20 07/18/2021   ALT 20 07/18/2021   PROT 7.3 07/18/2021   ALBUMIN 4.6 07/18/2021   CALCIUM 9.3 07/18/2021   ANIONGAP 8 07/09/2021   EGFR 99 07/18/2021   Lab Results  Component Value Date   CHOL 126 02/17/2022   Lab Results  Component Value Date   HDL 35 (L) 02/17/2022   Lab Results  Component Value Date   LDLCALC 75 02/17/2022   Lab Results  Component Value Date   TRIG 78 02/17/2022   Lab Results  Component Value Date   CHOLHDL 3.6 02/17/2022   Lab Results  Component Value Date   HGBA1C 5.4 09/04/2022      Assessment & Plan:   Problem List Items Addressed This Visit       Cardiovascular and Mediastinum   High blood pressure    BP Readings from Last 3 Encounters:  09/04/22 137/85  05/06/22 131/89  04/14/22 122/90  Currently uncontrolled without medication DASH diet encouraged patient encouraged to engage in regular moderate exercises at least 150 minutes weekly Continue to monitor blood pressure at home call the office if blood pressure stays greater than 140/90 Follow-up in 6 months        Digestive   Gastroesophageal reflux disease - Primary    Start Protonix 40 mg daily, Take medication daily as needed once his condition is under control Need to avoid spicy food, fried food, chocolates, coffee alcohol discussed with the patient.  Avoid laying down within 2 hours of eating      Relevant Medications   pantoprazole (PROTONIX) 40 MG tablet     Other   Tobacco abuse counseling    Smokes about less th 0.5 pack/day  Asked about quitting: confirms that he/she currently smokes cigarettes Advise to quit smoking: Educated about QUITTING to  reduce the risk of cancer, cardio and cerebrovascular disease. Assess willingness: Unwilling to quit at this time, but is working on cutting back. Assist with counseling and pharmacotherapy: Counseled for 5 minutes and literature provided. Arrange for follow up: follow up in 6 months and continue to offer help.       Prediabetes    Lab Results  Component Value Date   HGBA1C 5.4 09/04/2022  Normal labs        Relevant Orders   POCT glycosylated hemoglobin (Hb A1C) (Completed)   Hyperlipidemia LDL goal <100    Currently on atorvastatin 20 mg daily Lab Results  Component Value Date   CHOL 126 02/17/2022   HDL 35 (L) 02/17/2022   LDLCALC 75 02/17/2022   TRIG 78 02/17/2022   CHOLHDL 3.6 02/17/2022  Continue current medication avoid fried fatty foods will check lipid panel at next visit       Meds ordered this encounter  Medications   pantoprazole (PROTONIX) 40 MG tablet    Sig: Take 1 tablet (40 mg total) by mouth daily.    Dispense:  30 tablet    Refill:  3    Follow-up: Return in about 6 months (around 03/06/2023) for CPE.    Renee Rival, FNP

## 2022-09-04 NOTE — Assessment & Plan Note (Signed)
Smokes about less th 0.5 pack/day  Asked about quitting: confirms that he/she currently smokes cigarettes Advise to quit smoking: Educated about QUITTING to reduce the risk of cancer, cardio and cerebrovascular disease. Assess willingness: Unwilling to quit at this time, but is working on cutting back. Assist with counseling and pharmacotherapy: Counseled for 5 minutes and literature provided. Arrange for follow up: follow up in 6 months and continue to offer help.

## 2022-09-04 NOTE — Patient Instructions (Addendum)
  1. Gastroesophageal reflux disease, unspecified whether esophagitis present  - pantoprazole (PROTONIX) 40 MG tablet; Take 1 tablet (40 mg total) by mouth daily.  Dispense: 30 tablet; Refill: 3    It is important that you exercise regularly at least 30 minutes 5 times a week as tolerated  Think about what you will eat, plan ahead. Choose " clean, green, fresh or frozen" over canned, processed or packaged foods which are more sugary, salty and fatty. 70 to 75% of food eaten should be vegetables and fruit. Three meals at set times with snacks allowed between meals, but they must be fruit or vegetables. Aim to eat over a 12 hour period , example 7 am to 7 pm, and STOP after  your last meal of the day. Drink water,generally about 64 ounces per day, no other drink is as healthy. Fruit juice is best enjoyed in a healthy way, by EATING the fruit.  Thanks for choosing [facility] we consider it a privelige to serve you.

## 2022-09-04 NOTE — Assessment & Plan Note (Addendum)
Lab Results  Component Value Date   HGBA1C 5.4 09/04/2022  Normal labs

## 2022-09-04 NOTE — Assessment & Plan Note (Signed)
Start Protonix 40 mg daily, Take medication daily as needed once his condition is under control Need to avoid spicy food, fried food, chocolates, coffee alcohol discussed with the patient.  Avoid laying down within 2 hours of eating

## 2022-09-04 NOTE — Assessment & Plan Note (Signed)
BP Readings from Last 3 Encounters:  09/04/22 137/85  05/06/22 131/89  04/14/22 122/90  Currently uncontrolled without medication DASH diet encouraged patient encouraged to engage in regular moderate exercises at least 150 minutes weekly Continue to monitor blood pressure at home call the office if blood pressure stays greater than 140/90 Follow-up in 6 months

## 2022-09-17 ENCOUNTER — Other Ambulatory Visit: Payer: Self-pay

## 2022-10-19 ENCOUNTER — Other Ambulatory Visit: Payer: Self-pay

## 2022-11-19 ENCOUNTER — Other Ambulatory Visit: Payer: Self-pay

## 2022-11-20 ENCOUNTER — Other Ambulatory Visit: Payer: Self-pay

## 2022-12-25 ENCOUNTER — Other Ambulatory Visit: Payer: Self-pay

## 2022-12-28 ENCOUNTER — Other Ambulatory Visit: Payer: Self-pay

## 2022-12-31 ENCOUNTER — Other Ambulatory Visit: Payer: Self-pay

## 2023-01-05 ENCOUNTER — Other Ambulatory Visit: Payer: Self-pay

## 2023-02-10 ENCOUNTER — Encounter: Payer: Self-pay | Admitting: Nurse Practitioner

## 2023-02-10 ENCOUNTER — Other Ambulatory Visit: Payer: Self-pay

## 2023-02-10 ENCOUNTER — Ambulatory Visit (INDEPENDENT_AMBULATORY_CARE_PROVIDER_SITE_OTHER): Payer: Self-pay | Admitting: Nurse Practitioner

## 2023-02-10 VITALS — BP 131/86 | HR 78 | Temp 98.0°F | Ht 67.0 in | Wt 192.6 lb

## 2023-02-10 DIAGNOSIS — K219 Gastro-esophageal reflux disease without esophagitis: Secondary | ICD-10-CM

## 2023-02-10 DIAGNOSIS — H43393 Other vitreous opacities, bilateral: Secondary | ICD-10-CM

## 2023-02-10 DIAGNOSIS — Z1321 Encounter for screening for nutritional disorder: Secondary | ICD-10-CM

## 2023-02-10 DIAGNOSIS — Z13228 Encounter for screening for other metabolic disorders: Secondary | ICD-10-CM

## 2023-02-10 DIAGNOSIS — E7849 Other hyperlipidemia: Secondary | ICD-10-CM

## 2023-02-10 DIAGNOSIS — F32A Depression, unspecified: Secondary | ICD-10-CM | POA: Insufficient documentation

## 2023-02-10 DIAGNOSIS — E669 Obesity, unspecified: Secondary | ICD-10-CM

## 2023-02-10 DIAGNOSIS — I10 Essential (primary) hypertension: Secondary | ICD-10-CM

## 2023-02-10 DIAGNOSIS — Z130 Encounter for screening for diseases of the blood and blood-forming organs and certain disorders involving the immune mechanism: Secondary | ICD-10-CM

## 2023-02-10 DIAGNOSIS — Z0000 Encounter for general adult medical examination without abnormal findings: Secondary | ICD-10-CM

## 2023-02-10 DIAGNOSIS — F419 Anxiety disorder, unspecified: Secondary | ICD-10-CM

## 2023-02-10 DIAGNOSIS — Z716 Tobacco abuse counseling: Secondary | ICD-10-CM

## 2023-02-10 DIAGNOSIS — Z1329 Encounter for screening for other suspected endocrine disorder: Secondary | ICD-10-CM

## 2023-02-10 MED ORDER — AMLODIPINE BESYLATE 10 MG PO TABS
10.0000 mg | ORAL_TABLET | Freq: Every day | ORAL | 5 refills | Status: DC
  Filled 2023-02-10: qty 30, 30d supply, fill #0
  Filled 2023-03-15 (×2): qty 30, 30d supply, fill #1
  Filled 2023-04-10 (×2): qty 30, 30d supply, fill #2
  Filled 2023-05-13: qty 30, 30d supply, fill #3
  Filled 2023-06-09 (×2): qty 30, 30d supply, fill #4
  Filled 2023-07-06 (×2): qty 30, 30d supply, fill #5

## 2023-02-10 MED ORDER — ATORVASTATIN CALCIUM 20 MG PO TABS
20.0000 mg | ORAL_TABLET | Freq: Every day | ORAL | 6 refills | Status: DC
  Filled 2023-02-10: qty 30, 30d supply, fill #0
  Filled 2023-03-15 (×2): qty 30, 30d supply, fill #1
  Filled 2023-04-10 (×2): qty 30, 30d supply, fill #2
  Filled 2023-05-13 – 2023-05-14 (×2): qty 30, 30d supply, fill #3
  Filled 2023-06-09 (×2): qty 30, 30d supply, fill #4
  Filled 2023-07-06 (×2): qty 30, 30d supply, fill #5
  Filled 2023-08-04 – 2023-08-12 (×3): qty 30, 30d supply, fill #6

## 2023-02-10 MED ORDER — BUPROPION HCL ER (SR) 150 MG PO TB12
ORAL_TABLET | ORAL | 3 refills | Status: DC
  Filled 2023-02-10: qty 60, 32d supply, fill #0

## 2023-02-10 MED ORDER — PANTOPRAZOLE SODIUM 40 MG PO TBEC
40.0000 mg | DELAYED_RELEASE_TABLET | Freq: Every day | ORAL | 3 refills | Status: DC | PRN
  Filled 2023-02-10: qty 30, 30d supply, fill #0
  Filled 2023-03-15 (×2): qty 30, 30d supply, fill #1
  Filled 2023-04-10 (×2): qty 30, 30d supply, fill #2
  Filled 2023-05-13 – 2023-05-14 (×2): qty 30, 30d supply, fill #3

## 2023-02-10 MED ORDER — BUPROPION HCL ER (SR) 150 MG PO TB12
150.0000 mg | ORAL_TABLET | Freq: Two times a day (BID) | ORAL | 3 refills | Status: DC
  Filled 2023-02-10: qty 60, 30d supply, fill #0

## 2023-02-10 NOTE — Assessment & Plan Note (Signed)
BP Readings from Last 3 Encounters:  02/10/23 131/86  09/04/22 137/85  05/06/22 131/89   HTN Controlled . Continue current medications. No changes in management. Discussed DASH diet and dietary sodium restrictions Continue to increase dietary efforts and exercise.  CMP today

## 2023-02-10 NOTE — Patient Instructions (Signed)
1. Anxiety and depression  - buPROPion (WELLBUTRIN SR) 150 MG 12 hr tablet; Take 1 tablet (150 mg total) by mouth 2 (two) times daily.  Dispense: 60 tablet; Refill: 3  2. Encounter for smoking cessation counseling  - buPROPion (WELLBUTRIN SR) 150 MG 12 hr tablet; Take 1 tablet (150 mg total) by mouth 2 (two) times daily.  Dispense: 60 tablet; Refill: 3  3. Primary hypertension  - amLODipine (NORVASC) 10 MG tablet; Take 1 tablet (10 mg total) by mouth once daily.  Dispense: 30 tablet; Refill: 5  4. Other hyperlipidemia  - atorvastatin (LIPITOR) 20 MG tablet; Take 1 tablet (20 mg total) by mouth once daily.  Dispense: 30 tablet; Refill: 6  5. Gastroesophageal reflux disease, unspecified whether esophagitis present  - pantoprazole (PROTONIX) 40 MG tablet; Take 1 tablet (40 mg total) by mouth daily as needed.  Dispense: 30 tablet; Refill: 3   It is important that you exercise regularly at least 30 minutes 5 times a week as tolerated  Think about what you will eat, plan ahead. Choose " clean, green, fresh or frozen" over canned, processed or packaged foods which are more sugary, salty and fatty. 70 to 75% of food eaten should be vegetables and fruit. Three meals at set times with snacks allowed between meals, but they must be fruit or vegetables. Aim to eat over a 12 hour period , example 7 am to 7 pm, and STOP after  your last meal of the day. Drink water,generally about 64 ounces per day, no other drink is as healthy. Fruit juice is best enjoyed in a healthy way, by EATING the fruit.  Thanks for choosing [facility] we consider it a privelige to serve you.

## 2023-02-10 NOTE — Assessment & Plan Note (Signed)
Vision 20/20 bilaterally Patient referred to ophthalmologist Has history of hypertension and hyperlipidemia

## 2023-02-10 NOTE — Assessment & Plan Note (Signed)
Patient counseled on low-carb diet He was encouraged to engage in regular moderate to vigorous exercise with at least 150 minutes weekly Benefits of healthy weights discussed Wt Readings from Last 3 Encounters:  02/10/23 192 lb 9.6 oz (87.4 kg)  09/04/22 185 lb 12.8 oz (84.3 kg)  04/14/22 181 lb 3.2 oz (82.2 kg)  Body mass index is 30.17 kg/m.

## 2023-02-10 NOTE — Assessment & Plan Note (Signed)
Smokes about 0.5 pack/day  Asked about quitting: confirms that he/she currently smokes cigarettes Advise to quit smoking: Educated about QUITTING to reduce the risk of cancer, cardio and cerebrovascular disease. Assess willingness: Unwilling to quit at this time, but is working on cutting back. Assist with counseling and pharmacotherapy: Counseled for 5 minutes and literature provided. Arrange for follow up: follow up in 6 weeks and continue to offer help.  On Wellbutrin for depression this will also assist with smoking cessation

## 2023-02-10 NOTE — Assessment & Plan Note (Signed)
Flowsheet Row Office Visit from 02/10/2023 in [facility]  PHQ-9 Total Score 19          02/10/2023   10:00 AM  GAD 7 : Generalized Anxiety Score  Nervous, Anxious, on Edge 3  Control/stop worrying 3  Worry too much - different things 3  Trouble relaxing 3  Restless 2  Easily annoyed or irritable 3  Afraid - awful might happen 3  Total GAD 7 Score 20  Anxiety Difficulty Somewhat difficult   Start Wellbutrin 150 mg  Take150 mg daily for 3 days and on day 4 start taking medication twice daily Patient referred for counseling Follow-up in 6 weeks

## 2023-02-10 NOTE — Assessment & Plan Note (Signed)
Annual exam as documented.  Counseling done include healthy lifestyle involving committing to 150 minutes of exercise per week, heart healthy diet, and attaining healthy weight. The importance of adequate sleep also discussed.  Regular use of seat belt and home safety were also discussed . Changes in health habits are decided on by patient with goals and time frames set for achieving them. Immunization and cancer screening  needs are specifically addressed at this visit.    Routine fasting labs ordered

## 2023-02-10 NOTE — Assessment & Plan Note (Signed)
Pantoprazole 40 mg daily as needed refilled

## 2023-02-10 NOTE — Progress Notes (Signed)
Complete physical exam  Patient: Jason Li   DOB: 30-Nov-1980   42 y.o. Male  MRN: [id number]  Subjective:    Chief Complaint  Patient presents with   Hypertension   Headache   Dizziness    Jason Li is a 42 y.o. male who presents today for a complete physical exam. He reports consuming a general diet. The patient has a physically strenuous job, but has no regular exercise apart from work.  He generally feels well. He reports sleeping fairly well.  Depression and anxiety .He has been dealing with this for most of his life but has not discussed it with his doctors .feels like his condition is related to his drinking and smoking he is worried about his health scared of being diabetic .he denies SI, HI he continues to smoke half pack of cigarettes daily drinks 2 to 324 ounces of beer daily.  He has never been on medication for depression and anxiety and has not attended therapy before.   He sometimes has headache and dizziness with when he is hungry, sees floaters after lightening of his cigarettes.  He denies fever, chills, malaise, chest pain shortness of breath wheezing numbness, tingling. blood pressure has been well-controlled at home. . Blood pressure 131/86, pulse 78, temperature 98 F (36.7 C), height 5\' 7"  (1.702 m), weight 192 lb 9.6 oz (87.4 kg), SpO2 98 %.    Most recent fall risk assessment:    02/10/2023    9:58 AM  Fall Risk   Falls in the past year? 0  Number falls in past yr: 0  Injury with Fall? 0  Risk for fall due to : No Fall Risks  Follow up Falls evaluation completed     Most recent depression screenings:    02/10/2023    9:58 AM 09/04/2022    3:11 PM  PHQ 2/9 Scores  PHQ - 2 Score 6 0  PHQ- 9 Score 19         Patient Care Team: Donell Beers, FNP as PCP - General (Nurse Practitioner)   Outpatient Medications Prior to Visit  Medication Sig   [DISCONTINUED] amLODipine (NORVASC) 10 MG tablet Take 1 tablet (10 mg total) by mouth  once daily.   [DISCONTINUED] atorvastatin (LIPITOR) 20 MG tablet Take 1 tablet (20 mg total) by mouth once daily.   [DISCONTINUED] pantoprazole (PROTONIX) 40 MG tablet Take 1 tablet (40 mg total) by mouth daily.   No facility-administered medications prior to visit.    Review of Systems  Constitutional:  Negative for activity change, appetite change, chills, diaphoresis, fatigue, fever and unexpected weight change.  HENT:  Negative for congestion, dental problem, drooling and ear discharge.   Eyes:  Negative for pain, discharge, redness and itching.       Sees floaters  Respiratory:  Negative for apnea, cough, choking, chest tightness, shortness of breath and wheezing.   Cardiovascular: Negative.  Negative for chest pain, palpitations and leg swelling.  Gastrointestinal:  Negative for abdominal distention, abdominal pain, anal bleeding, blood in stool, constipation, diarrhea and vomiting.  Endocrine: Negative for polydipsia, polyphagia and polyuria.  Genitourinary:  Negative for difficulty urinating, flank pain, frequency and genital sores.  Musculoskeletal: Negative.  Negative for arthralgias, back pain, gait problem and joint swelling.  Skin:  Negative for color change, pallor and rash.  Neurological:  Positive for dizziness and headaches. Negative for facial asymmetry, light-headedness and numbness.  Psychiatric/Behavioral:  Positive for sleep disturbance. Negative for agitation, behavioral problems,  confusion, hallucinations, self-injury and suicidal ideas.        Objective:     BP 131/86   Pulse 78   Temp 98 F (36.7 C)   Ht 5\' 7"  (1.702 m)   Wt 192 lb 9.6 oz (87.4 kg)   SpO2 98%   BMI 30.17 kg/m    Physical Exam Vitals and nursing note reviewed. Exam conducted with a chaperone present.  Constitutional:      General: He is not in acute distress.    Appearance: Normal appearance. He is obese. He is not ill-appearing, toxic-appearing or diaphoretic.  HENT:     Right  Ear: Tympanic membrane, ear canal and external ear normal. There is no impacted cerumen.     Left Ear: Tympanic membrane, ear canal and external ear normal. There is no impacted cerumen.     Nose: Nose normal. No congestion or rhinorrhea.     Mouth/Throat:     Mouth: Mucous membranes are moist.     Pharynx: Oropharynx is clear. No oropharyngeal exudate or posterior oropharyngeal erythema.  Eyes:     General: No scleral icterus.       Right eye: No discharge.        Left eye: No discharge.     Extraocular Movements: Extraocular movements intact.     Conjunctiva/sclera: Conjunctivae normal.     Comments: Vision 20/20 bilaterally   Neck:     Vascular: No carotid bruit.  Cardiovascular:     Rate and Rhythm: Normal rate and regular rhythm.     Pulses: Normal pulses.     Heart sounds: Normal heart sounds. No murmur heard.    No friction rub. No gallop.  Pulmonary:     Effort: Pulmonary effort is normal. No respiratory distress.     Breath sounds: Normal breath sounds. No stridor. No wheezing, rhonchi or rales.  Chest:     Chest wall: No tenderness.  Abdominal:     General: Bowel sounds are normal. There is no distension.     Palpations: Abdomen is soft. There is no mass.     Tenderness: There is no abdominal tenderness. There is no right CVA tenderness, left CVA tenderness, guarding or rebound.     Hernia: No hernia is present.  Musculoskeletal:        General: No swelling, tenderness, deformity or signs of injury.     Cervical back: Normal range of motion and neck supple. No rigidity or tenderness.     Right lower leg: No edema.     Left lower leg: No edema.  Lymphadenopathy:     Cervical: No cervical adenopathy.  Skin:    General: Skin is warm and dry.     Capillary Refill: Capillary refill takes less than 2 seconds.     Coloration: Skin is not jaundiced or pale.     Findings: No bruising, erythema, lesion or rash.  Neurological:     Mental Status: He is alert and oriented to  person, place, and time.     Cranial Nerves: No cranial nerve deficit.     Sensory: No sensory deficit.     Motor: No weakness.     Coordination: Coordination normal.     Gait: Gait normal.     Deep Tendon Reflexes: Reflexes normal.  Psychiatric:        Mood and Affect: Mood normal.        Behavior: Behavior normal.        Thought Content: Thought content normal.  Judgment: Judgment normal.     No results found for any visits on 02/10/23.     Assessment & Plan:    Routine Health Maintenance and Physical Exam  Immunization History  Administered Date(s) Administered   PFIZER(Purple Top)SARS-COV-2 Vaccination 02/02/2020   Tdap 01/13/2019    Health Maintenance  Topic Date Due   Hepatitis C Screening  Never done   HIV Screening  09/05/2023 (Originally 12/09/1996)   COVID-19 Vaccine (2 - 2023-24 season) 09/21/2023 (Originally 06/05/2022)   INFLUENZA VACCINE  05/06/2023   DTaP/Tdap/Td (2 - Td or Tdap) 01/12/2029   HPV VACCINES  Aged Out    Discussed health benefits of physical activity, and encouraged him to engage in regular exercise appropriate for his age and condition.  Problem List Items Addressed This Visit       Cardiovascular and Mediastinum   High blood pressure    BP Readings from Last 3 Encounters:  02/10/23 131/86  09/04/22 137/85  05/06/22 131/89   HTN Controlled . Continue current medications. No changes in management. Discussed DASH diet and dietary sodium restrictions Continue to increase dietary efforts and exercise.  CMP today      Relevant Medications   amLODipine (NORVASC) 10 MG tablet   atorvastatin (LIPITOR) 20 MG tablet     Digestive   Gastroesophageal reflux disease    Pantoprazole 40 mg daily as needed refilled      Relevant Medications   pantoprazole (PROTONIX) 40 MG tablet     Other   Tobacco abuse counseling    Smokes about 0.5 pack/day  Asked about quitting: confirms that he/she currently smokes cigarettes Advise to  quit smoking: Educated about QUITTING to reduce the risk of cancer, cardio and cerebrovascular disease. Assess willingness: Unwilling to quit at this time, but is working on cutting back. Assist with counseling and pharmacotherapy: Counseled for 5 minutes and literature provided. Arrange for follow up: follow up in 6 weeks and continue to offer help.  On Wellbutrin for depression this will also assist with smoking cessation      Encounter for smoking cessation counseling   Relevant Medications   buPROPion (WELLBUTRIN SR) 150 MG 12 hr tablet   Anxiety and depression    Flowsheet Row Office Visit from 02/10/2023 in [facility]  PHQ-9 Total Score 19          02/10/2023   10:00 AM  GAD 7 : Generalized Anxiety Score  Nervous, Anxious, on Edge 3  Control/stop worrying 3  Worry too much - different things 3  Trouble relaxing 3  Restless 2  Easily annoyed or irritable 3  Afraid - awful might happen 3  Total GAD 7 Score 20  Anxiety Difficulty Somewhat difficult  Start Wellbutrin 150 mg  Take150 mg daily for 3 days and on day 4 start taking medication twice daily Patient referred for counseling Follow-up in 6 weeks      Relevant Medications   buPROPion (WELLBUTRIN SR) 150 MG 12 hr tablet   Annual physical exam - Primary    Annual exam as documented.  Counseling done include healthy lifestyle involving committing to 150 minutes of exercise per week, heart healthy diet, and attaining healthy weight. The importance of adequate sleep also discussed.  Regular use of seat belt and home safety were also discussed . Changes in health habits are decided on by patient with goals and time frames set for achieving them. Immunization and cancer screening  needs are specifically addressed at this visit.  Routine fasting labs ordered      Floaters, bilateral    Vision 20/20 bilaterally Patient referred to ophthalmologist Has history of hypertension and hyperlipidemia       Relevant Orders   Ambulatory referral to Ophthalmology   Obesity (BMI 30-39.9)    Patient counseled on low-carb diet He was encouraged to engage in regular moderate to vigorous exercise with at least 150 minutes weekly Benefits of healthy weights discussed Wt Readings from Last 3 Encounters:  02/10/23 192 lb 9.6 oz (87.4 kg)  09/04/22 185 lb 12.8 oz (84.3 kg)  04/14/22 181 lb 3.2 oz (82.2 kg)  Body mass index is 30.17 kg/m.       Other Visit Diagnoses     Other hyperlipidemia       Relevant Medications   amLODipine (NORVASC) 10 MG tablet   atorvastatin (LIPITOR) 20 MG tablet   Screening for endocrine, nutritional, metabolic and immunity disorder       Relevant Orders   CBC with Differential/Platelet   CMP14+EGFR   Hepatitis C antibody   TSH   VITAMIN D 25 Hydroxy (Vit-D Deficiency, Fractures)   Lipid panel      Return in about 6 weeks (around 03/24/2023) for ANXIETY, DEPRESSION, F/U WITH SUSAN FOR COUNSELING.     Donell Beers, FNP

## 2023-02-11 LAB — CBC WITH DIFFERENTIAL/PLATELET
Basophils Absolute: 0.1 10*3/uL (ref 0.0–0.2)
Basos: 1 %
EOS (ABSOLUTE): 0.3 10*3/uL (ref 0.0–0.4)
Eos: 3 %
Hematocrit: 43.2 % (ref 37.5–51.0)
Hemoglobin: 14.7 g/dL (ref 13.0–17.7)
Immature Grans (Abs): 0 10*3/uL (ref 0.0–0.1)
Immature Granulocytes: 0 %
Lymphocytes Absolute: 3.1 10*3/uL (ref 0.7–3.1)
Lymphs: 35 %
MCH: 29.6 pg (ref 26.6–33.0)
MCHC: 34 g/dL (ref 31.5–35.7)
MCV: 87 fL (ref 79–97)
Monocytes Absolute: 1.1 10*3/uL — ABNORMAL HIGH (ref 0.1–0.9)
Monocytes: 13 %
Neutrophils Absolute: 4.3 10*3/uL (ref 1.4–7.0)
Neutrophils: 48 %
Platelets: 199 10*3/uL (ref 150–450)
RBC: 4.96 x10E6/uL (ref 4.14–5.80)
RDW: 13.5 % (ref 11.6–15.4)
WBC: 8.9 10*3/uL (ref 3.4–10.8)

## 2023-02-11 LAB — TSH: TSH: 2.09 u[IU]/mL (ref 0.450–4.500)

## 2023-02-11 LAB — CMP14+EGFR
ALT: 37 IU/L (ref 0–44)
AST: 30 IU/L (ref 0–40)
Albumin/Globulin Ratio: 1.6 (ref 1.2–2.2)
Albumin: 4.3 g/dL (ref 4.1–5.1)
Alkaline Phosphatase: 84 IU/L (ref 44–121)
BUN/Creatinine Ratio: 13 (ref 9–20)
BUN: 13 mg/dL (ref 6–24)
Bilirubin Total: 0.2 mg/dL (ref 0.0–1.2)
CO2: 21 mmol/L (ref 20–29)
Calcium: 9.2 mg/dL (ref 8.7–10.2)
Chloride: 104 mmol/L (ref 96–106)
Creatinine, Ser: 1.03 mg/dL (ref 0.76–1.27)
Globulin, Total: 2.7 g/dL (ref 1.5–4.5)
Glucose: 86 mg/dL (ref 70–99)
Potassium: 3.9 mmol/L (ref 3.5–5.2)
Sodium: 141 mmol/L (ref 134–144)
Total Protein: 7 g/dL (ref 6.0–8.5)
eGFR: 94 mL/min/{1.73_m2} (ref >59)

## 2023-02-11 LAB — LIPID PANEL
Chol/HDL Ratio: 3.8 ratio (ref 0.0–5.0)
Cholesterol, Total: 121 mg/dL (ref 100–199)
HDL: 32 mg/dL — ABNORMAL LOW (ref >39)
LDL Chol Calc (NIH): 74 mg/dL (ref 0–99)
Triglycerides: 72 mg/dL (ref 0–149)
VLDL Cholesterol Cal: 15 mg/dL (ref 5–40)

## 2023-02-11 LAB — VITAMIN D 25 HYDROXY (VIT D DEFICIENCY, FRACTURES): Vit D, 25-Hydroxy: 9.8 ng/mL — ABNORMAL LOW (ref 30.0–100.0)

## 2023-02-11 LAB — HEPATITIS C ANTIBODY: Hep C Virus Ab: NONREACTIVE

## 2023-02-12 ENCOUNTER — Other Ambulatory Visit: Payer: Self-pay | Admitting: Nurse Practitioner

## 2023-02-12 ENCOUNTER — Telehealth: Payer: Self-pay | Admitting: Nurse Practitioner

## 2023-02-12 ENCOUNTER — Other Ambulatory Visit: Payer: Self-pay

## 2023-02-12 DIAGNOSIS — E559 Vitamin D deficiency, unspecified: Secondary | ICD-10-CM

## 2023-02-12 MED ORDER — VITAMIN D (ERGOCALCIFEROL) 1.25 MG (50000 UNIT) PO CAPS
50000.0000 [IU] | ORAL_CAPSULE | ORAL | 0 refills | Status: AC
Start: 2023-02-12 — End: ?
  Filled 2023-02-12: qty 8, 56d supply, fill #0

## 2023-02-12 NOTE — Telephone Encounter (Signed)
Pt LVM on nurse line 02/11/2023 requesting a call back from the nurse to review test results. [phone]

## 2023-02-16 NOTE — Telephone Encounter (Signed)
Lvm for pt to call back for lab results Kh 

## 2023-02-25 ENCOUNTER — Institutional Professional Consult (permissible substitution): Payer: Self-pay | Admitting: Clinical

## 2023-03-12 ENCOUNTER — Ambulatory Visit: Payer: Self-pay | Admitting: Nurse Practitioner

## 2023-03-15 ENCOUNTER — Other Ambulatory Visit: Payer: Self-pay

## 2023-03-16 ENCOUNTER — Other Ambulatory Visit: Payer: Self-pay

## 2023-03-26 ENCOUNTER — Ambulatory Visit: Payer: Self-pay | Admitting: Nurse Practitioner

## 2023-04-10 ENCOUNTER — Other Ambulatory Visit (HOSPITAL_COMMUNITY): Payer: Self-pay

## 2023-04-10 ENCOUNTER — Other Ambulatory Visit: Payer: Self-pay

## 2023-04-12 ENCOUNTER — Other Ambulatory Visit: Payer: Self-pay

## 2023-04-14 ENCOUNTER — Other Ambulatory Visit: Payer: Self-pay

## 2023-04-16 ENCOUNTER — Other Ambulatory Visit: Payer: Self-pay

## 2023-05-14 ENCOUNTER — Other Ambulatory Visit: Payer: Self-pay

## 2023-06-09 ENCOUNTER — Other Ambulatory Visit: Payer: Self-pay

## 2023-06-09 ENCOUNTER — Other Ambulatory Visit: Payer: Self-pay | Admitting: Nurse Practitioner

## 2023-06-09 DIAGNOSIS — K219 Gastro-esophageal reflux disease without esophagitis: Secondary | ICD-10-CM

## 2023-06-09 MED ORDER — PANTOPRAZOLE SODIUM 40 MG PO TBEC
40.0000 mg | DELAYED_RELEASE_TABLET | Freq: Every day | ORAL | 3 refills | Status: AC | PRN
Start: 2023-06-09 — End: ?
  Filled 2023-06-09 – 2023-07-06 (×2): qty 30, 30d supply, fill #0
  Filled 2023-08-29 – 2023-08-30 (×2): qty 30, 30d supply, fill #1

## 2023-06-15 ENCOUNTER — Other Ambulatory Visit: Payer: Self-pay

## 2023-07-06 ENCOUNTER — Other Ambulatory Visit: Payer: Self-pay

## 2023-08-04 ENCOUNTER — Other Ambulatory Visit: Payer: Self-pay | Admitting: Nurse Practitioner

## 2023-08-04 ENCOUNTER — Other Ambulatory Visit (HOSPITAL_COMMUNITY): Payer: Self-pay

## 2023-08-04 ENCOUNTER — Other Ambulatory Visit: Payer: Self-pay

## 2023-08-04 DIAGNOSIS — I10 Essential (primary) hypertension: Secondary | ICD-10-CM

## 2023-08-05 ENCOUNTER — Other Ambulatory Visit (HOSPITAL_COMMUNITY): Payer: Self-pay

## 2023-08-05 MED ORDER — AMLODIPINE BESYLATE 10 MG PO TABS
10.0000 mg | ORAL_TABLET | Freq: Every day | ORAL | 5 refills | Status: DC
Start: 2023-08-05 — End: 2024-04-06
  Filled 2023-08-05 – 2023-08-12 (×3): qty 30, 30d supply, fill #0
  Filled 2023-08-29: qty 30, 30d supply, fill #1
  Filled 2023-09-07: qty 30, 30d supply, fill #0
  Filled 2023-09-07 – 2023-10-07 (×2): qty 30, 30d supply, fill #1
  Filled 2023-11-06: qty 30, 30d supply, fill #2
  Filled 2023-12-04: qty 30, 30d supply, fill #3
  Filled 2024-03-13 (×2): qty 30, 30d supply, fill #4

## 2023-08-12 ENCOUNTER — Other Ambulatory Visit: Payer: Self-pay

## 2023-08-12 ENCOUNTER — Other Ambulatory Visit (HOSPITAL_COMMUNITY): Payer: Self-pay

## 2023-08-29 ENCOUNTER — Other Ambulatory Visit: Payer: Self-pay | Admitting: Nurse Practitioner

## 2023-08-29 DIAGNOSIS — E7849 Other hyperlipidemia: Secondary | ICD-10-CM

## 2023-08-30 ENCOUNTER — Other Ambulatory Visit: Payer: Self-pay

## 2023-08-30 ENCOUNTER — Other Ambulatory Visit (HOSPITAL_COMMUNITY): Payer: Self-pay

## 2023-08-30 MED ORDER — ATORVASTATIN CALCIUM 20 MG PO TABS
20.0000 mg | ORAL_TABLET | Freq: Every day | ORAL | 6 refills | Status: DC
  Filled 2023-08-30 – 2023-09-07 (×3): qty 30, 30d supply, fill #0
  Filled 2023-10-07: qty 30, 30d supply, fill #1
  Filled 2023-11-06: qty 30, 30d supply, fill #2
  Filled 2023-12-04: qty 30, 30d supply, fill #3
  Filled 2024-03-13 (×2): qty 30, 30d supply, fill #4
  Filled 2024-04-06: qty 30, 30d supply, fill #5
  Filled 2024-05-11 – 2024-06-12 (×3): qty 30, 30d supply, fill #6

## 2023-09-07 ENCOUNTER — Other Ambulatory Visit: Payer: Self-pay

## 2023-10-07 ENCOUNTER — Other Ambulatory Visit (HOSPITAL_COMMUNITY): Payer: Self-pay

## 2023-11-06 ENCOUNTER — Other Ambulatory Visit (HOSPITAL_COMMUNITY): Payer: Self-pay

## 2023-12-06 ENCOUNTER — Other Ambulatory Visit (HOSPITAL_COMMUNITY): Payer: Self-pay

## 2024-03-13 ENCOUNTER — Other Ambulatory Visit: Payer: Self-pay

## 2024-03-14 ENCOUNTER — Other Ambulatory Visit: Payer: Self-pay

## 2024-04-06 ENCOUNTER — Other Ambulatory Visit: Payer: Self-pay

## 2024-04-06 ENCOUNTER — Other Ambulatory Visit: Payer: Self-pay | Admitting: Nurse Practitioner

## 2024-04-06 DIAGNOSIS — I10 Essential (primary) hypertension: Secondary | ICD-10-CM

## 2024-04-06 MED ORDER — AMLODIPINE BESYLATE 10 MG PO TABS
10.0000 mg | ORAL_TABLET | Freq: Every day | ORAL | 5 refills | Status: DC
  Filled 2024-04-06: qty 30, 30d supply, fill #0
  Filled 2024-05-11 – 2024-06-12 (×3): qty 30, 30d supply, fill #1
  Filled 2024-07-07: qty 30, 30d supply, fill #2
  Filled 2024-08-07: qty 30, 30d supply, fill #3
  Filled 2024-09-12: qty 30, 30d supply, fill #4
  Filled 2024-10-09: qty 30, 30d supply, fill #5

## 2024-04-10 ENCOUNTER — Other Ambulatory Visit: Payer: Self-pay

## 2024-04-12 ENCOUNTER — Other Ambulatory Visit: Payer: Self-pay

## 2024-04-13 ENCOUNTER — Other Ambulatory Visit: Payer: Self-pay

## 2024-04-14 ENCOUNTER — Other Ambulatory Visit: Payer: Self-pay

## 2024-04-18 ENCOUNTER — Other Ambulatory Visit: Payer: Self-pay

## 2024-05-11 ENCOUNTER — Other Ambulatory Visit: Payer: Self-pay

## 2024-05-12 ENCOUNTER — Other Ambulatory Visit: Payer: Self-pay

## 2024-05-18 ENCOUNTER — Other Ambulatory Visit: Payer: Self-pay

## 2024-05-19 ENCOUNTER — Other Ambulatory Visit: Payer: Self-pay

## 2024-06-12 ENCOUNTER — Other Ambulatory Visit: Payer: Self-pay

## 2024-07-07 ENCOUNTER — Other Ambulatory Visit: Payer: Self-pay

## 2024-07-07 ENCOUNTER — Other Ambulatory Visit: Payer: Self-pay | Admitting: Nurse Practitioner

## 2024-07-07 DIAGNOSIS — E7849 Other hyperlipidemia: Secondary | ICD-10-CM

## 2024-07-07 MED ORDER — ATORVASTATIN CALCIUM 20 MG PO TABS
20.0000 mg | ORAL_TABLET | Freq: Every day | ORAL | 6 refills | Status: DC
  Filled 2024-07-07: qty 30, 30d supply, fill #0
  Filled 2024-08-07: qty 30, 30d supply, fill #1
  Filled 2024-09-12: qty 30, 30d supply, fill #2
  Filled 2024-10-09: qty 30, 30d supply, fill #3
  Filled 2024-11-08: qty 30, 30d supply, fill #4

## 2024-08-07 ENCOUNTER — Other Ambulatory Visit: Payer: Self-pay

## 2024-08-08 ENCOUNTER — Other Ambulatory Visit: Payer: Self-pay

## 2024-09-12 ENCOUNTER — Other Ambulatory Visit: Payer: Self-pay

## 2024-10-09 ENCOUNTER — Other Ambulatory Visit: Payer: Self-pay

## 2024-10-10 ENCOUNTER — Other Ambulatory Visit: Payer: Self-pay

## 2024-11-08 ENCOUNTER — Other Ambulatory Visit: Payer: Self-pay

## 2024-11-08 ENCOUNTER — Encounter: Payer: Self-pay | Admitting: Nurse Practitioner

## 2024-11-08 ENCOUNTER — Other Ambulatory Visit: Payer: Self-pay | Admitting: Nurse Practitioner

## 2024-11-08 ENCOUNTER — Ambulatory Visit: Payer: Self-pay | Admitting: Nurse Practitioner

## 2024-11-08 VITALS — BP 119/78 | HR 98 | Wt 181.0 lb

## 2024-11-08 DIAGNOSIS — I10 Essential (primary) hypertension: Secondary | ICD-10-CM

## 2024-11-08 DIAGNOSIS — F17200 Nicotine dependence, unspecified, uncomplicated: Secondary | ICD-10-CM | POA: Diagnosis not present

## 2024-11-08 DIAGNOSIS — E785 Hyperlipidemia, unspecified: Secondary | ICD-10-CM | POA: Diagnosis not present

## 2024-11-08 DIAGNOSIS — E7849 Other hyperlipidemia: Secondary | ICD-10-CM

## 2024-11-08 MED ORDER — NICOTINE 7 MG/24HR TD PT24
7.0000 mg | MEDICATED_PATCH | Freq: Every day | TRANSDERMAL | 0 refills | Status: AC
  Filled 2024-11-08: qty 28, 28d supply, fill #0

## 2024-11-08 MED ORDER — ATORVASTATIN CALCIUM 20 MG PO TABS: 20.0000 mg | ORAL_TABLET | Freq: Every day | ORAL | 1 refills | Status: AC

## 2024-11-08 MED ORDER — NICOTINE 14 MG/24HR TD PT24
14.0000 mg | MEDICATED_PATCH | Freq: Every day | TRANSDERMAL | 0 refills | Status: AC
  Filled 2024-11-08: qty 42, 42d supply, fill #0

## 2024-11-08 MED ORDER — AMLODIPINE BESYLATE 10 MG PO TABS
10.0000 mg | ORAL_TABLET | Freq: Every day | ORAL | 1 refills | Status: AC
  Filled 2024-11-08: qty 90, 90d supply, fill #0

## 2024-11-08 NOTE — Patient Instructions (Addendum)
.  1. Primary hypertension (Primary) - amLODipine  (NORVASC ) 10 MG tablet; Take 1 tablet (10 mg total) by mouth once daily.  Dispense: 90 tablet; Refill: 1  2. Tobacco use disorder - nicotine  (NICODERM CQ  - DOSED IN MG/24 HOURS) 14 mg/24hr patch; Place 1 patch (14 mg total) onto the skin daily.  Dispense: 42 patch; Refill: 0 - nicotine  (NICODERM CQ  - DOSED IN MG/24 HR) 7 mg/24hr patch; Place 1 patch (7 mg total) onto the skin daily.  Dispense: 28 patch; Refill: 0  3. Other hyperlipidemia - atorvastatin  (LIPITOR) 20 MG tablet; Take 1 tablet (20 mg total) by mouth once daily.  Dispense: 90 tablet; Refill: 1     It is important that you exercise regularly at least 30 minutes 5 times a week as tolerated  Think about what you will eat, plan ahead. Choose  clean, green, fresh or frozen over canned, processed or packaged foods which are more sugary, salty and fatty. 70 to 75% of food eaten should be vegetables and fruit. Three meals at set times with snacks allowed between meals, but they must be fruit or vegetables. Aim to eat over a 12 hour period , example 7 am to 7 pm, and STOP after  your last meal of the day. Drink water,generally about 64 ounces per day, no other drink is as healthy. Fruit juice is best enjoyed in a healthy way, by EATING the fruit.  [facility] for choosing [facility] we consider it a privelige to serve you.

## 2024-11-08 NOTE — Assessment & Plan Note (Signed)
 Lab Results  Component Value Date   CHOL 121 02/10/2023   HDL 32 (L) 02/10/2023   LDLCALC 74 02/10/2023   TRIG 72 02/10/2023   CHOLHDL 3.8 02/10/2023  On atorvastatin  20 mg daily Will recheck labs at next visit

## 2024-11-08 NOTE — Assessment & Plan Note (Signed)
 BP Readings from Last 3 Encounters:  11/08/24 119/78  02/10/23 131/86  09/04/22 137/85   HTN Controlled .  On amlodipine  10 mg daily Continue current medications. No changes in management. Discussed DASH diet and dietary sodium restrictions Continue to increase dietary efforts and exercise.  Recent CMP was normal

## 2024-11-08 NOTE — Assessment & Plan Note (Signed)
 Smokes about  0.5 pack/day  Asked about quitting: confirms that he currently smokes cigarettes Advise to quit smoking: Educated about QUITTING to reduce the risk of cancer, cardio and cerebrovascular disease. Assess willingness: willing to quit at this time, and is working on cutting back. Assist with counseling and pharmacotherapy: Counseled for 3 minutes and literature provided. Arrange for follow up: follow up in 2 months and continue to offer help.  Starting nicotine  patch take 14 mg daily for 6 weeks then 7 mg daily

## 2024-11-08 NOTE — Telephone Encounter (Signed)
amLODipine (NORVASC) 10 MG tablet  

## 2024-11-08 NOTE — Progress Notes (Signed)
 "  Established Patient Office Visit  Subjective:  Patient ID: Jason Li, male    DOB: May 17, 1981  Age: 44 y.o. MRN: [id number]  CC:  Chief Complaint  Patient presents with   Medical Management of Chronic Issues    HPI    Discussed the use of AI scribe software for clinical note transcription with the patient, who gave verbal consent to proceed.  History of Present Illness Jason Li is a 44 year old male  has a past medical history of Amputee, Anxiety, Chest pain, Depression, Dizziness, GERD (gastroesophageal reflux disease), HLD (hyperlipidemia), and HTN (hypertension). who presents for medication refills.  He has hypertension and hyperlipidemia, for which he takes amlodipine  10 mg daily and atorvastatin  20 mg daily, respectively. He adheres to these medications and has not been seen since 2024.  He has a history of smoking since the age of 66 and currently smokes less than half a pack per day. He previously attempted smoking cessation with Wellbutrin  but discontinued due to adverse effects. He is interested in trying alternative smoking cessation aids.  He experiences some shortness of breath. He recently recovered from the flu and is currently feeling well.  He has not received a flu vaccine recently and Pneumovax vaccine, he declined both vaccines today     Assessment & Plan    Past Medical History:  Diagnosis Date   Amputee    Anxiety    Chest pain    Depression    Dizziness    GERD (gastroesophageal reflux disease)    HLD (hyperlipidemia)    HTN (hypertension)     Past Surgical History:  Procedure Laterality Date   I & D EXTREMITY Left 01/13/2019   Procedure: Revision of Amputation, IRRIGATION and Debridement Left ring finger.;  Surgeon: Carolee Lynwood JINNY DOUGLAS, MD;  Location: [facility] OR;  Service: Orthopedics;  Laterality: Left;    Family History  Problem Relation Age of Onset   Depression Mother     Social History   Socioeconomic History   Marital  status: Married    Spouse name: Not on file   Number of children: 2   Years of education: Not on file   Highest education level: Not on file  Occupational History   Not on file  Tobacco Use   Smoking status: Every Day    Current packs/day: 0.50    Average packs/day: 0.5 packs/day for 20.0 years (10.0 ttl pk-yrs)    Types: Cigarettes   Smokeless tobacco: Never  Substance and Sexual Activity   Alcohol use: Yes    Alcohol/week: 3.0 standard drinks of alcohol    Types: 3 Cans of beer per week    Comment: daily- 3 drinks/day   Drug use: No   Sexual activity: Yes  Other Topics Concern   Not on file  Social History Narrative   Lives with his wife    Social Drivers of Health   Tobacco Use: High Risk (11/08/2024)   Patient History    Smoking Tobacco Use: Every Day    Smokeless Tobacco Use: Never    Passive Exposure: Not on file  Financial Resource Strain: Not on file  Food Insecurity: Not on file  Transportation Needs: Not on file  Physical Activity: Not on file  Stress: Not on file  Social Connections: Not on file  Intimate Partner Violence: Not on file  Depression (PHQ2-9): Low Risk (11/08/2024)   Depression (PHQ2-9)    PHQ-2 Score: 0  Alcohol Screen: Not on file  Housing: Not on file  Utilities: Not on file  Health Literacy: Not on file    Outpatient Medications Prior to Visit  Medication Sig Dispense Refill   amLODipine  (NORVASC ) 10 MG tablet Take 1 tablet (10 mg total) by mouth once daily. 30 tablet 5   atorvastatin  (LIPITOR) 20 MG tablet Take 1 tablet (20 mg total) by mouth once daily. 30 tablet 6   pantoprazole  (PROTONIX ) 40 MG tablet Take 1 tablet (40 mg total) by mouth daily as needed. (Patient not taking: Reported on 11/08/2024) 30 tablet 3   Vitamin D , Ergocalciferol , (DRISDOL ) 1.25 MG (50000 UNIT) CAPS capsule Take 1 capsule (50,000 Units total) by mouth every 7 (seven) days. (Patient not taking: Reported on 11/08/2024) 8 capsule 0   buPROPion  (WELLBUTRIN  SR) 150 MG  12 hr tablet Take 1 tablet (150 mg total) by mouth daily for 3 days, THEN 1 tablet (150 mg total) 2 (two) times daily. (Patient not taking: No sig reported) 60 tablet 3   No facility-administered medications prior to visit.    Allergies[1]  ROS Review of Systems  Constitutional:  Negative for appetite change, chills, fatigue and fever.  HENT:  Negative for congestion, postnasal drip, rhinorrhea and sneezing.   Respiratory:  Positive for shortness of breath. Negative for cough and wheezing.   Cardiovascular:  Negative for chest pain, palpitations and leg swelling.  Gastrointestinal:  Negative for abdominal pain, constipation, nausea and vomiting.  Genitourinary:  Negative for difficulty urinating, dysuria, flank pain and frequency.  Musculoskeletal:  Negative for arthralgias, back pain, joint swelling and myalgias.  Skin:  Negative for color change, pallor, rash and wound.  Neurological:  Negative for dizziness, facial asymmetry, weakness, numbness and headaches.  Psychiatric/Behavioral:  Negative for behavioral problems, confusion, self-injury and suicidal ideas.       Objective:    Physical Exam Vitals and nursing note reviewed.  Constitutional:      General: He is not in acute distress.    Appearance: Normal appearance. He is not ill-appearing, toxic-appearing or diaphoretic.  Eyes:     General: No scleral icterus.       Right eye: No discharge.        Left eye: No discharge.     Extraocular Movements: Extraocular movements intact.     Conjunctiva/sclera: Conjunctivae normal.  Cardiovascular:     Rate and Rhythm: Normal rate and regular rhythm.     Pulses: Normal pulses.     Heart sounds: Normal heart sounds. No murmur heard.    No friction rub. No gallop.  Pulmonary:     Effort: Pulmonary effort is normal. No respiratory distress.     Breath sounds: Normal breath sounds. No stridor. No wheezing, rhonchi or rales.  Chest:     Chest wall: No tenderness.  Abdominal:      General: There is no distension.     Palpations: Abdomen is soft.     Tenderness: There is no abdominal tenderness. There is no right CVA tenderness, left CVA tenderness or guarding.  Musculoskeletal:        General: No swelling, tenderness, deformity or signs of injury.     Right lower leg: No edema.     Left lower leg: No edema.  Skin:    General: Skin is warm and dry.     Capillary Refill: Capillary refill takes less than 2 seconds.     Coloration: Skin is not jaundiced or pale.     Findings: No bruising, erythema or lesion.  Neurological:     Mental Status: He is alert and oriented to person, place, and time.     Motor: No weakness.     Coordination: Coordination normal.     Gait: Gait normal.  Psychiatric:        Mood and Affect: Mood normal.        Behavior: Behavior normal.        Thought Content: Thought content normal.        Judgment: Judgment normal.     BP 119/78   Pulse 98   Wt 181 lb (82.1 kg)   SpO2 100%   BMI 28.35 kg/m  Wt Readings from Last 3 Encounters:  11/08/24 181 lb (82.1 kg)  02/10/23 192 lb 9.6 oz (87.4 kg)  09/04/22 185 lb 12.8 oz (84.3 kg)    Lab Results  Component Value Date   TSH 2.090 02/10/2023   Lab Results  Component Value Date   WBC 8.9 02/10/2023   HGB 14.7 02/10/2023   HCT 43.2 02/10/2023   MCV 87 02/10/2023   PLT 199 02/10/2023   Lab Results  Component Value Date   NA 141 02/10/2023   K 3.9 02/10/2023   CO2 21 02/10/2023   GLUCOSE 86 02/10/2023   BUN 13 02/10/2023   CREATININE 1.03 02/10/2023   BILITOT 0.2 02/10/2023   ALKPHOS 84 02/10/2023   AST 30 02/10/2023   ALT 37 02/10/2023   PROT 7.0 02/10/2023   ALBUMIN 4.3 02/10/2023   CALCIUM  9.2 02/10/2023   ANIONGAP 8 07/09/2021   EGFR 94 02/10/2023   Lab Results  Component Value Date   CHOL 121 02/10/2023   Lab Results  Component Value Date   HDL 32 (L) 02/10/2023   Lab Results  Component Value Date   LDLCALC 74 02/10/2023   Lab Results  Component Value  Date   TRIG 72 02/10/2023   Lab Results  Component Value Date   CHOLHDL 3.8 02/10/2023   Lab Results  Component Value Date   HGBA1C 5.4 09/04/2022      Assessment & Plan:   Problem List Items Addressed This Visit       Cardiovascular and Mediastinum   High blood pressure - Primary   BP Readings from Last 3 Encounters:  11/08/24 119/78  02/10/23 131/86  09/04/22 137/85   HTN Controlled .  On amlodipine  10 mg daily Continue current medications. No changes in management. Discussed DASH diet and dietary sodium restrictions Continue to increase dietary efforts and exercise.  Recent CMP was normal        Relevant Medications   amLODipine  (NORVASC ) 10 MG tablet   atorvastatin  (LIPITOR) 20 MG tablet     Other   Tobacco use disorder   Smokes about  0.5 pack/day  Asked about quitting: confirms that he currently smokes cigarettes Advise to quit smoking: Educated about QUITTING to reduce the risk of cancer, cardio and cerebrovascular disease. Assess willingness: willing to quit at this time, and is working on cutting back. Assist with counseling and pharmacotherapy: Counseled for 3 minutes and literature provided. Arrange for follow up: follow up in 2 months and continue to offer help.  Starting nicotine  patch take 14 mg daily for 6 weeks then 7 mg daily      Relevant Medications   nicotine  (NICODERM CQ  - DOSED IN MG/24 HOURS) 14 mg/24hr patch   nicotine  (NICODERM CQ  - DOSED IN MG/24 HR) 7 mg/24hr patch (Start on 12/27/2024)   Hyperlipidemia LDL goal <100  Lab Results  Component Value Date   CHOL 121 02/10/2023   HDL 32 (L) 02/10/2023   LDLCALC 74 02/10/2023   TRIG 72 02/10/2023   CHOLHDL 3.8 02/10/2023  On atorvastatin  20 mg daily Will recheck labs at next visit      Relevant Medications   amLODipine  (NORVASC ) 10 MG tablet   atorvastatin  (LIPITOR) 20 MG tablet   Other Visit Diagnoses       Other hyperlipidemia       Relevant Medications   amLODipine   (NORVASC ) 10 MG tablet   atorvastatin  (LIPITOR) 20 MG tablet       Meds ordered this encounter  Medications   nicotine  (NICODERM CQ  - DOSED IN MG/24 HOURS) 14 mg/24hr patch    Sig: Place 1 patch (14 mg total) onto the skin daily.    Dispense:  42 patch    Refill:  0   amLODipine  (NORVASC ) 10 MG tablet    Sig: Take 1 tablet (10 mg total) by mouth once daily.    Dispense:  90 tablet    Refill:  1   atorvastatin  (LIPITOR) 20 MG tablet    Sig: Take 1 tablet (20 mg total) by mouth once daily.    Dispense:  90 tablet    Refill:  1   nicotine  (NICODERM CQ  - DOSED IN MG/24 HR) 7 mg/24hr patch    Sig: Place 1 patch (7 mg total) onto the skin daily.    Dispense:  28 patch    Refill:  0    Follow-up: Return in about 2 months (around 01/06/2025) for CPE.    Sallie Staron R Florentine Diekman, FNP    [1] No Known Allergies  "

## 2024-11-09 ENCOUNTER — Other Ambulatory Visit (HOSPITAL_COMMUNITY): Payer: Self-pay

## 2024-11-09 ENCOUNTER — Other Ambulatory Visit: Payer: Self-pay

## 2025-04-04 ENCOUNTER — Encounter: Payer: Self-pay | Admitting: Nurse Practitioner
